# Patient Record
Sex: Female | Born: 1985 | Race: White | Hispanic: No | Marital: Married | Smoking: Never smoker
Health system: Southern US, Community
[De-identification: ages and names within clinical notes are randomized; demographics above are authoritative.]

## PROBLEM LIST (undated history)

## (undated) ENCOUNTER — Inpatient Hospital Stay (HOSPITAL_COMMUNITY): Payer: Self-pay

## (undated) DIAGNOSIS — R51 Headache: Secondary | ICD-10-CM

## (undated) HISTORY — DX: Headache: R51

---
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
## 2007-07-22 ENCOUNTER — Inpatient Hospital Stay (HOSPITAL_COMMUNITY): Admission: AD | Admit: 2007-07-22 | Discharge: 2007-07-24 | Payer: Self-pay | Admitting: Obstetrics and Gynecology

## 2007-07-22 ENCOUNTER — Ambulatory Visit: Payer: Self-pay | Admitting: Obstetrics and Gynecology

## 2008-01-05 ENCOUNTER — Other Ambulatory Visit: Admission: RE | Admit: 2008-01-05 | Discharge: 2008-01-05 | Payer: Self-pay | Admitting: Obstetrics and Gynecology

## 2010-10-25 NOTE — Op Note (Signed)
NAME:  Rita Oconnor, Rita Oconnor                           ACCOUNT NO.:  [id number]   MEDICAL RECORD NO.:  [id number]                  PATIENT TYPE:   LOCATION:  403                                  FACILITY:   PHYSICIAN:  Tilda Burrow, M.D.              DATE OF BIRTH:   DATE OF PROCEDURE:  DATE OF DISCHARGE:                                 OPERATIVE REPORT   DELIVERY NOTE   Rita Oconnor was routinely examined at approximately 10:15 and was noted to be  fully dilated at +3 station so she began pushing and after pushing briefly  and effectively she delivered a viable female infant at 11:04.  The mouth  and nose were suctioned on the perineum with a bulb syringe.  A nuchal cord  was reduced without difficulty.  The body delivered without difficulty.  Weight is 6 pounds 6 ounces, Apgars are 9 and 9.  Twenty units of Pitocin  diluted in 1000 cc of lactated Ringers was infused rapidly IV.   The placenta separated spontaneously and was delivered at 11:08 a.m.  It was  inspected and appears to be intact with a 3-vessel cord. The vagina was then  inspected and a first degree labial lacerations, not necessitating repair  was noted.  Estimated blood loss 200 cc.  Epidural catheter was then removed  with the blue tip visualized as being intact.     ________________________________________  ___________________________________________  Jacklyn Shell, C.N.M.           Tilda Burrow, M.D.   FC/MEDQ  D:  11/02/2003  T:  11/02/2003  Job:  [id number]   cc:   [facility]

## 2010-10-25 NOTE — Op Note (Signed)
NAME:  Rita Oconnor, Rita Oconnor                         ACCOUNT NO.:  [id number]   MEDICAL RECORD NO.:  [id number]                   PATIENT TYPE:  INP   LOCATION:  A403                                 FACILITY:  [facility]   PHYSICIAN:  Lazaro Arms, M.D.                DATE OF BIRTH:  10-Sep-1985   DATE OF PROCEDURE:  DATE OF DISCHARGE:  11/04/2003                                 OPERATIVE REPORT   PROCEDURE:  Epidural placement.   INDICATIONS FOR PROCEDURE:  Thursa is an 25 year old gravida 1, para 0 at 96-  1/[redacted] weeks gestation admitted with an unfavorable cervix.  She had Cervidil  placed last evening.  She is having very quick contractile activity, one on  top of the other.  Cervidil has been removed.  She is requesting an  epidural.   DESCRIPTION OF PROCEDURE:  The patient is placed in the sitting position.  Betadine prep is used.  A 17-gauge Tuohy needle is employed after local  anesthetic placed.  The L2-L3 interspace is identified and loss of  resistance technique employed.  Bupivacaine 0.125% plain, 10 cc, is given  into the epidural space without difficulty.  The epidural catheter is fed  into the epidural space, and an additional 10 cc is given of 0.125%  bupivacaine to dose up the epidural.  It was taped 5 cm into the epidural  space.  The continuous pump is then hooked up at 12 cc an hour.  The patient  had no ill effects.  She is comfortable, and the fetal heart rate tracing is  stable, and blood pressure is stable.      ___________________________________________                                            Lazaro Arms, M.D.   LHE/MEDQ  D:  11/16/2003  T:  11/17/2003  Job:  [id number]

## 2010-10-25 NOTE — H&P (Signed)
NAME:  Rita Oconnor, Rita Oconnor                         ACCOUNT NO.:  [id number]   MEDICAL RECORD NO.:  [id number]                   PATIENT TYPE:  INP   LOCATION:  A428                                 FACILITY:  [facility]   PHYSICIAN:  Lazaro Arms, M.D.                DATE OF BIRTH:  [birth year]   DATE OF ADMISSION:  11/01/2003  DATE OF DISCHARGE:                                HISTORY & PHYSICAL   Karyss is an 25 year old white female, gravida 1, para 0, with an estimated  date of delivery of Oct 28, 2003, currently at 52 and 4/7 weeks' gestation,  who is admitted for cervical ripening and induction of labor.  Her  antepartum course has been unremarkable.  Her cervix is unfavorable,  basically long, thick and closed.  It is vertex and the baby is quite low.  As a result, she is admitted for Cervidil placement and then Pitocin  induction.   PAST MEDICAL HISTORY:  Negative.   PAST SURGICAL HISTORY:  Negative.   PAST OB HISTORY:  Negative.   ALLERGIES:  None.   MEDICATIONS:  Prenatal vitamins.   BLOOD TYPE:  O+.   Rubella immune; antibody screen is negative; hepatitis B is negative; HIV is  nonreactive; serology nonreactive; Pap was normal; GC and chlamydia are both  negative; AFP was normal; group B Strep was negative; Glucola was 115.   HEENT:  Unremarkable.  THYROID:  Normal.  LUNGS:  Clear.  HEART:  Regular rhythm without regurgitation or gallop.  BREASTS:  Deferred.  GENITOURINARY:  Fundal height is 37 cm.  Cervix is small, thick and closed.  Vertex is quite low.  EXTREMITIES:  Warm with no edema.   IMPRESSION:  1. Intrauterine pregnancy at 48 and 4/7 weeks' gestation.  2. Unfavorable cervix.   PLAN:  The patient is admitted for Cervidil cervical ripening followed by  Pitocin induction.  She understands the risks, benefits, indications and  alternatives and we will proceed.     ___________________________________________                                         Lazaro Arms, M.D.   Loraine Maple  D:  11/01/2003  T:  11/01/2003  Job:  [id number]

## 2011-02-28 LAB — CBC
HCT: 25.5 — ABNORMAL LOW
HCT: 32.6 — ABNORMAL LOW
Hemoglobin: 11.3 — ABNORMAL LOW
Hemoglobin: 8.9 — ABNORMAL LOW
MCHC: 34.7
MCHC: 34.8
MCV: 90.8
MCV: 92
Platelets: 147 — ABNORMAL LOW
Platelets: 220
RBC: 2.77 — ABNORMAL LOW
RBC: 3.59 — ABNORMAL LOW
RDW: 14.3
RDW: 14.6
WBC: 11.4 — ABNORMAL HIGH
WBC: 11.6 — ABNORMAL HIGH

## 2011-02-28 LAB — RPR: RPR Ser Ql: NONREACTIVE

## 2011-05-23 ENCOUNTER — Emergency Department (HOSPITAL_COMMUNITY)
Admission: EM | Admit: 2011-05-23 | Discharge: 2011-05-23 | Disposition: A | Discharge status: Home / Self Care | Payer: Self-pay | Attending: Emergency Medicine | Admitting: Emergency Medicine

## 2011-05-23 ENCOUNTER — Encounter: Payer: Self-pay | Admitting: *Deleted

## 2011-05-23 ENCOUNTER — Emergency Department (HOSPITAL_COMMUNITY): Payer: Self-pay

## 2011-05-23 DIAGNOSIS — M5416 Radiculopathy, lumbar region: Secondary | ICD-10-CM

## 2011-05-23 DIAGNOSIS — M545 Low back pain, unspecified: Secondary | ICD-10-CM | POA: Insufficient documentation

## 2011-05-23 DIAGNOSIS — IMO0002 Reserved for concepts with insufficient information to code with codable children: Secondary | ICD-10-CM | POA: Insufficient documentation

## 2011-05-23 MED ORDER — PREDNISONE 20 MG PO TABS: 60.0000 mg | ORAL_TABLET | Freq: Every day | ORAL | Status: DC

## 2011-05-23 MED ORDER — OXYCODONE-ACETAMINOPHEN 5-325 MG PO TABS: ORAL_TABLET | ORAL | Status: DC

## 2011-05-23 MED ORDER — PREDNISONE 20 MG PO TABS
60.0000 mg | ORAL_TABLET | Freq: Once | ORAL | Status: AC
  Administered 2011-05-23: 60 mg via ORAL
  Filled 2011-05-23: qty 3

## 2011-05-23 MED ORDER — OXYCODONE-ACETAMINOPHEN 5-325 MG PO TABS
1.0000 | ORAL_TABLET | Freq: Once | ORAL | Status: AC
  Administered 2011-05-23: 1 via ORAL
  Filled 2011-05-23: qty 1

## 2011-05-23 NOTE — ED Provider Notes (Signed)
Medical screening examination/treatment/procedure(s) were performed by non-physician practitioner and as supervising physician I was immediately available for consultation/collaboration.   Benny Lennert, MD 05/23/11 1539

## 2011-05-23 NOTE — ED Provider Notes (Signed)
History     CSN: [id number] Arrival date & time: 05/23/2011 10:02 AM   First MD Initiated Contact with Patient 05/23/11 1038      Chief Complaint  Patient presents with  . Tailbone Pain    (Consider location/radiation/quality/duration/timing/severity/associated sxs/prior treatment) HPI Comments: Pt was taking a shower this AM.  Raised a leg onto side of tub and had an immediate pain in her lower back.  Pain now radiating to her R knee.  H/o low back pain in past.  The history is provided by the patient. No language interpreter was used.    History reviewed. No pertinent past medical history.  History reviewed. No pertinent past surgical history.  No family history on file.  History  Substance Use Topics  . Smoking status: Never Smoker   . Smokeless tobacco: Not on file  . Alcohol Use: No    OB History    Grav Para Term Preterm Abortions TAB SAB Ect Mult Living                  Review of Systems  Musculoskeletal: Positive for back pain.  All other systems reviewed and are negative.    Allergies  Review of patient's allergies indicates no known allergies.  Home Medications  No current outpatient prescriptions on file.  BP 132/77  Pulse 88  Temp(Src) 97.7 F (36.5 C) (Oral)  Resp 20  Ht 6' (1.829 m)  Wt 175 lb (79.379 kg)  BMI 23.73 kg/m2  SpO2 100%  LMP 05/23/2011  Physical Exam  Nursing note and vitals reviewed. Constitutional: She is oriented to person, place, and time. She appears well-developed and well-nourished. No distress.  HENT:  Head: Normocephalic and atraumatic.  Eyes: EOM are normal.  Neck: Normal range of motion.  Cardiovascular: Normal rate, regular rhythm and normal heart sounds.   Pulmonary/Chest: Effort normal and breath sounds normal.  Abdominal: Soft. She exhibits no distension. There is no tenderness.  Musculoskeletal:       Lumbar back: She exhibits decreased range of motion and pain. She exhibits no tenderness, no bony  tenderness, no swelling, no edema, no deformity, no laceration, no spasm and normal pulse.       Back:  Neurological: She is alert and oriented to person, place, and time. She displays normal reflexes. Coordination normal.  Reflex Scores:      Patellar reflexes are 3+ on the right side and 3+ on the left side.      Achilles reflexes are 3+ on the right side and 3+ on the left side. Skin: Skin is warm and dry.  Psychiatric: She has a normal mood and affect. Judgment normal.    ED Course  Procedures (including critical care time)  Labs Reviewed - No data to display No results found.   No diagnosis found.    MDM  Discussed x-ray results with pt and her husband.  Pt states she is feeling better than before.        Worthy Rancher, PA 05/23/11 [phone]

## 2011-05-23 NOTE — ED Notes (Signed)
States was taking shower and raised right leg and felt sharp pain in lower back near tailbone area.  Denies falling.  C/o increased pain since.

## 2011-12-03 ENCOUNTER — Emergency Department (HOSPITAL_COMMUNITY)
Admission: EM | Admit: 2011-12-03 | Discharge: 2011-12-03 | Disposition: A | Discharge status: Home / Self Care | Payer: Self-pay | Attending: Emergency Medicine | Admitting: Emergency Medicine

## 2011-12-03 ENCOUNTER — Encounter (HOSPITAL_COMMUNITY): Payer: Self-pay | Admitting: *Deleted

## 2011-12-03 DIAGNOSIS — R42 Dizziness and giddiness: Secondary | ICD-10-CM | POA: Insufficient documentation

## 2011-12-03 DIAGNOSIS — R0602 Shortness of breath: Secondary | ICD-10-CM | POA: Insufficient documentation

## 2011-12-03 LAB — CBC WITH DIFFERENTIAL/PLATELET
Basophils Relative: 0 % (ref 0–1)
Eosinophils Absolute: 0.1 10*3/uL (ref 0.0–0.7)
MCH: 30.6 pg (ref 26.0–34.0)
MCHC: 32.1 g/dL (ref 30.0–36.0)
Monocytes Relative: 8 % (ref 3–12)
Neutrophils Relative %: 61 % (ref 43–77)
Platelets: 249 10*3/uL (ref 150–400)

## 2011-12-03 LAB — D-DIMER, QUANTITATIVE: D-Dimer, Quant: <0.22 ug/mL-FEU (ref 0.00–0.48)

## 2011-12-03 LAB — URINALYSIS, ROUTINE W REFLEX MICROSCOPIC
Glucose, UA: NEGATIVE mg/dL
Ketones, ur: 15 mg/dL — AB
Leukocytes, UA: NEGATIVE
Specific Gravity, Urine: >1.03 — ABNORMAL HIGH (ref 1.005–1.030)
pH: 6 (ref 5.0–8.0)

## 2011-12-03 LAB — COMPREHENSIVE METABOLIC PANEL
Albumin: 4.7 g/dL (ref 3.5–5.2)
Alkaline Phosphatase: 42 U/L (ref 39–117)
BUN: 17 mg/dL (ref 6–23)
Potassium: 3.2 mEq/L — ABNORMAL LOW (ref 3.5–5.1)
Sodium: 139 mEq/L (ref 135–145)
Total Protein: 8 g/dL (ref 6.0–8.3)

## 2011-12-03 LAB — PREGNANCY, URINE: Preg Test, Ur: NEGATIVE

## 2011-12-03 LAB — LIPASE, BLOOD: Lipase: 26 U/L (ref 11–59)

## 2011-12-03 MED ORDER — ALBUTEROL SULFATE HFA 108 (90 BASE) MCG/ACT IN AERS
1.0000 | INHALATION_SPRAY | Freq: Four times a day (QID) | RESPIRATORY_TRACT | Status: DC | PRN
Start: 2011-12-03 — End: 2012-09-17

## 2011-12-03 NOTE — ED Notes (Signed)
Sob intermittently  For 2 mos, worse last week.   No cough, no cold, no fever.episodes last up to 1 minute.

## 2011-12-03 NOTE — Discharge Instructions (Signed)
RESOURCE GUIDE  Chronic Pain Problems: Contact [facility]  [phone] Patients need to be referred by their primary care doctor.  Insufficient Money for Medicine: Contact United Way:  call "211" or [facility] [phone].  No Primary Care Doctor: - Call [facility]  [phone] - can help you locate a primary care doctor that  accepts your insurance, provides certain services, etc. - Physician Referral Service- 1-800-533-3463  Agencies that provide inexpensive medical care: - [facility]  [phone] - [facility]  [phone] - [facility]  [phone] - [facility] Clinic  [phone] - Planned Parenthood  [phone] - [facility]  [phone]  Medicaid-accepting [place] Providers: - [facility]- [address]  [phone], Mon-Fri 9am-7pm, Sat 9am-1pm - [facility]- [address], [address]  [phone] - [facility]- [address], [address]  [phone] - [facility]- [address] [place] [address]  [phone] - Veita Bland- [address], [address], [phone]  Only accepts [place] Access Medicaid patients after they have their name  applied to their card  Self Pay (no insurance) in [place]: - Sickle Cell Patients: Dr Eric Dean, [facility]  [address], [phone] - [facility]- [address]  [phone]       -     [facility] [place] [facility]- [address]       -     [facility]- see information above (Speak to Pam H if you do not have insurance)       -  [facility]- [address], [phone]       -  [facility] [place]- [address],  [phone]       -  [facility]- [address] [place] [address], [phone]       -  Dr Osei-Bonsu-  [address], [place], [phone]       -  [facility]- [address], [phone]       -  [facility]- [address] [place] [address], [phone], also [address], [phone]       -    [facility]- [address], [phone], 1st & 3rd Saturday   every month, 10am-1pm  1) Find a Doctor and Pay Out of Pocket Although you won't have to find out who is covered by your insurance plan, it is a good idea to ask around and get recommendations. You will then need to call the office and see if the doctor you have chosen will accept you as a new patient and what types of options they offer for patients who are self-pay. Some doctors offer discounts or will set up payment plans for their patients who do not have insurance, but you will need to ask so you aren't surprised when you get to your appointment.  2) Contact Your Local Health Department Not all health departments have doctors that can see patients for sick visits, but many do, so it is worth a call to see if yours does. If you don't know where your local health department is, you can check in your phone book. The CDC also has a tool to help you locate your state's health department, and many state websites also have   listings of all of their local health departments.  3) Find a Walk-in Clinic If your illness is not likely to be very severe or complicated, you may want to try a walk in clinic. These are popping up all over the country in pharmacies, drugstores, and shopping centers. They're usually staffed by nurse practitioners or physician assistants that have been trained to treat common illnesses and complaints. They're usually fairly quick and inexpensive. However, if you have serious medical issues or chronic medical problems, these are probably not your best option  STD Testing - [place] [facility] [place], [facility] Clinic, [address], [place], phone [phone] or 1-877-539-9860.  Monday - Friday, call for an appointment. - [place]  [facility] [place], [facility]D Clinic, [address], [place], phone [phone] or 1-877-539-9860.  Monday - Friday, call for an appointment.  Abuse/Neglect: - [place] Child Abuse Hotline (336) 641-3795 - [place] Child Abuse Hotline 800-378-5315 (After Hours)  Emergency Shelter:  [facility] (336) 271-5985  Maternity Homes: - [facility] (336) 275-9566 - [facility] (704) 372-4663  MRSA Hotline #:   832-7006  [place] Resources  [facility] [place]  [employer] [place] [facility] [address]                 [address]         [address]  [place]                                               [place]                              [place] Phone:  [phone]                                  Phone:  [phone]                   Phone:  [phone]  [place] [facility], [phone] - [place] Services - [facility]- 1-888-581-9988       -     [facility] in [place], [address],                                  336-349-4454, Insurance  [place] Child Abuse Hotline (336) 342-1394 or (336) 342-3537 (After Hours)   Behavioral Health Services  Substance Abuse Resources: - [facility]  336-882-2125 - [facility] 336-784-9470 - [facility] 336-285-9073 - [facility] 336-845-3988 - Residential & Outpatient Substance Abuse Program  800-659-3381  Psychological Services: -  Health  [phone] - [facility]  [phone] - [place] [facility], [address], [place], ACCESS LINE: 1-800-853-5163 or 336-641-4981, Http://www.[facility].com/services/adult.htm  Dental Assistance  If unable to pay or uninsured, contact:  [facility] or [place] [facility] to become qualified for the adult dental  clinic.  Patients with Medicaid:  [facility] [address], [phone], [phone]  If unable   to pay, or uninsured, contact [facility] 4785762372) or [facility] Department (701) 185-0411 in [place], [phone] in [facility]) to become qualified for the adult dental clinic  Other Low-Cost Community Dental Services: - [facility]- [address] [place], [place], [place], [address], [phone], Ext. [address], 2nd and 4th Thursday of the month at 6:30am.  10 clients each day by appointment, can sometimes see walk-in patients if someone does not show for an appointment. [facility]- [address] [place], [place], [address], [phone] - [facility]- [address], [place], [place], [address], [phone] [facility] Health Department- [phone] [facility] Health Department- [phone] [facility] [phone]  Resource guide provided to help patient find a primary care physician for followup followup should include thyroid testing. Trial of albuterol inhaler in the meantime use every 6 hours 2 puffs for the next week to see if it makes a difference with the symptoms.

## 2011-12-03 NOTE — ED Provider Notes (Signed)
History     CSN: [id number]  Arrival date & time 12/03/11  2014   First MD Initiated Contact with Patient 12/03/11 2030      Chief Complaint  Patient presents with  . Shortness of Breath    (Consider location/radiation/quality/duration/timing/severity/associated sxs/prior treatment) Patient is a 26 y.o. female presenting with shortness of breath.  Shortness of Breath  The current episode started more than 2 weeks ago. The onset was sudden. The problem occurs frequently. The problem has been unchanged. The problem is moderate. Nothing relieves the symptoms. Nothing aggravates the symptoms. Associated symptoms include shortness of breath. Pertinent negatives include no chest pain, no chest pressure, no fever, no rhinorrhea, no sore throat, no stridor, no cough and no wheezing.   ongoing for 2 months but worse in the past week. Patient gets sudden onset brief episodes of shortness of breath the longest they last as a minute most are under a minute. Not associated with chest pain occasionally associated with dizziness. No prior symptoms similar to this. No nausea no vomiting no diarrhea no headache no throat tightness no sore throat. No cough or upper spore infection symptoms. Patient has not been seen for this. Past medical history is negative.  History reviewed. No pertinent past medical history.  History reviewed. No pertinent past surgical history.  History reviewed. No pertinent family history.  History  Substance Use Topics  . Smoking status: Never Smoker   . Smokeless tobacco: Not on file  . Alcohol Use: No    OB History    Grav Para Term Preterm Abortions TAB SAB Ect Mult Living                  Review of Systems  Constitutional: Negative for fever.  HENT: Negative for sore throat, rhinorrhea and neck pain.   Eyes: Negative for visual disturbance.  Respiratory: Positive for shortness of breath. Negative for cough, wheezing and stridor.   Cardiovascular: Negative for  chest pain.  Gastrointestinal: Negative for nausea, vomiting, abdominal pain and diarrhea.  Genitourinary: Negative for dysuria.  Musculoskeletal: Negative for back pain.  Skin: Negative for rash.  Neurological: Positive for dizziness. Negative for syncope, weakness, numbness and headaches.  Hematological: Does not bruise/bleed easily.    Allergies  Review of patient's allergies indicates no known allergies.  Home Medications  No current outpatient prescriptions on file.  BP 152/87  Pulse 80  Temp 98.3 F (36.8 C) (Oral)  Resp 20  Ht 6' (1.829 m)  Wt 175 lb (79.379 kg)  BMI 23.73 kg/m2  SpO2 100%  LMP 11/25/2011  Physical Exam  Nursing note and vitals reviewed. Constitutional: She is oriented to person, place, and time. She appears well-developed and well-nourished. No distress.  HENT:  Head: Normocephalic and atraumatic.  Mouth/Throat: Oropharynx is clear and moist. No oropharyngeal exudate.  Eyes: Conjunctivae and EOM are normal. Pupils are equal, round, and reactive to light.  Neck: Normal range of motion. Neck supple. No JVD present. No tracheal deviation present. No thyromegaly present.  Cardiovascular: Normal rate, regular rhythm and normal heart sounds.   No murmur heard. Pulmonary/Chest: Effort normal and breath sounds normal. No stridor. No respiratory distress. She has no wheezes. She has no rales. She exhibits no tenderness.  Abdominal: Soft. Bowel sounds are normal. There is no tenderness.  Musculoskeletal: Normal range of motion. She exhibits no edema and no tenderness.  Lymphadenopathy:    She has no cervical adenopathy.  Neurological: She is alert and oriented to person, place,  and time. No cranial nerve deficit. She exhibits normal muscle tone. Coordination normal.  Skin: Skin is warm. No rash noted.    ED Course  Procedures (including critical care time)   Labs Reviewed  CBC WITH DIFFERENTIAL  COMPREHENSIVE METABOLIC PANEL  URINALYSIS, ROUTINE W  REFLEX MICROSCOPIC  PREGNANCY, URINE  LIPASE, BLOOD  D-DIMER, QUANTITATIVE   No results found.  Date: 12/03/2011  Rate: 68  Rhythm: normal sinus rhythm, sinus arrhythmia and premature ventricular contractions (PVC)  QRS Axis: normal  Intervals: normal  ST/T Wave abnormalities: normal  Conduction Disutrbances:none  Narrative Interpretation:   Old EKG Reviewed: none available  Results for orders placed during the hospital encounter of 12/03/11  URINALYSIS, ROUTINE W REFLEX MICROSCOPIC      Component Value Range   Color, Urine AMBER (*) YELLOW   APPearance CLEAR  CLEAR   Specific Gravity, Urine >1.030 (*) 1.005 - 1.030   pH 6.0  5.0 - 8.0   Glucose, UA NEGATIVE  NEGATIVE mg/dL   Hgb urine dipstick NEGATIVE  NEGATIVE   Bilirubin Urine SMALL (*) NEGATIVE   Ketones, ur 15 (*) NEGATIVE mg/dL   Protein, ur NEGATIVE  NEGATIVE mg/dL   Urobilinogen, UA 0.2  0.0 - 1.0 mg/dL   Nitrite NEGATIVE  NEGATIVE   Leukocytes, UA NEGATIVE  NEGATIVE  PREGNANCY, URINE      Component Value Range   Preg Test, Ur NEGATIVE  NEGATIVE     1. Shortness of breath       MDM  The patient's episodes of shortness of breath are very brief they have been long-standing patient did have a premature ventricular contraction during her EKG but did not have a episode of the symptoms during that time. Workup will include chest x-ray labs EKG which is already done and cardiac monitoring. Urinalysis is negative pregnancy test is negative. If labs are all negative would recommend referral for thyroid testing will provide patient with resource guide to help find a primary care Dr. If cardiac monitoring were lab test worn a [place] that will be done by the hospitalist. Is suspected this time patient will be okay for discharge home.       Shelda Jakes, MD 12/03/11 2137

## 2011-12-20 IMAGING — CR DG LUMBAR SPINE COMPLETE 4+V
5 series · 5 of 5 positions shown · non-contrast
Comparison: None.

CLINICAL DATA: Acute low back pain with radiation to the right
knee.

LUMBAR SPINE - COMPLETE 4+ VIEW

[view not recorded (1 of 5)]
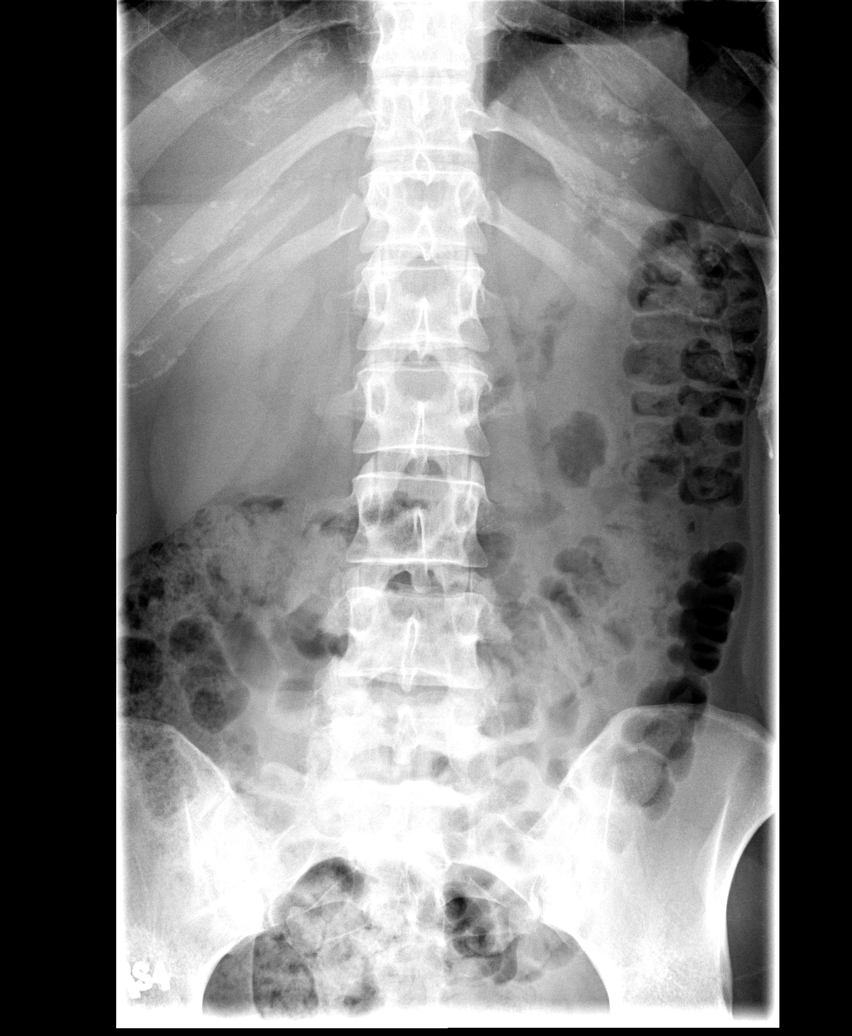

[view not recorded (2 of 5)]
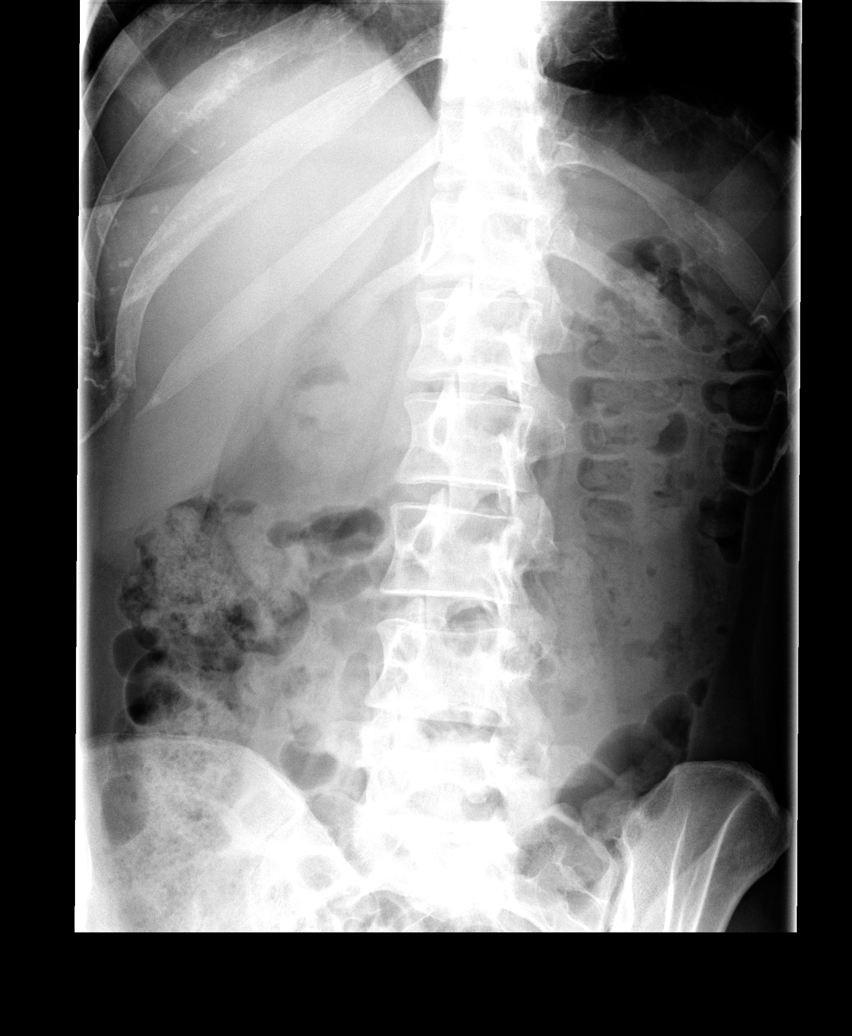

[view not recorded (3 of 5)]
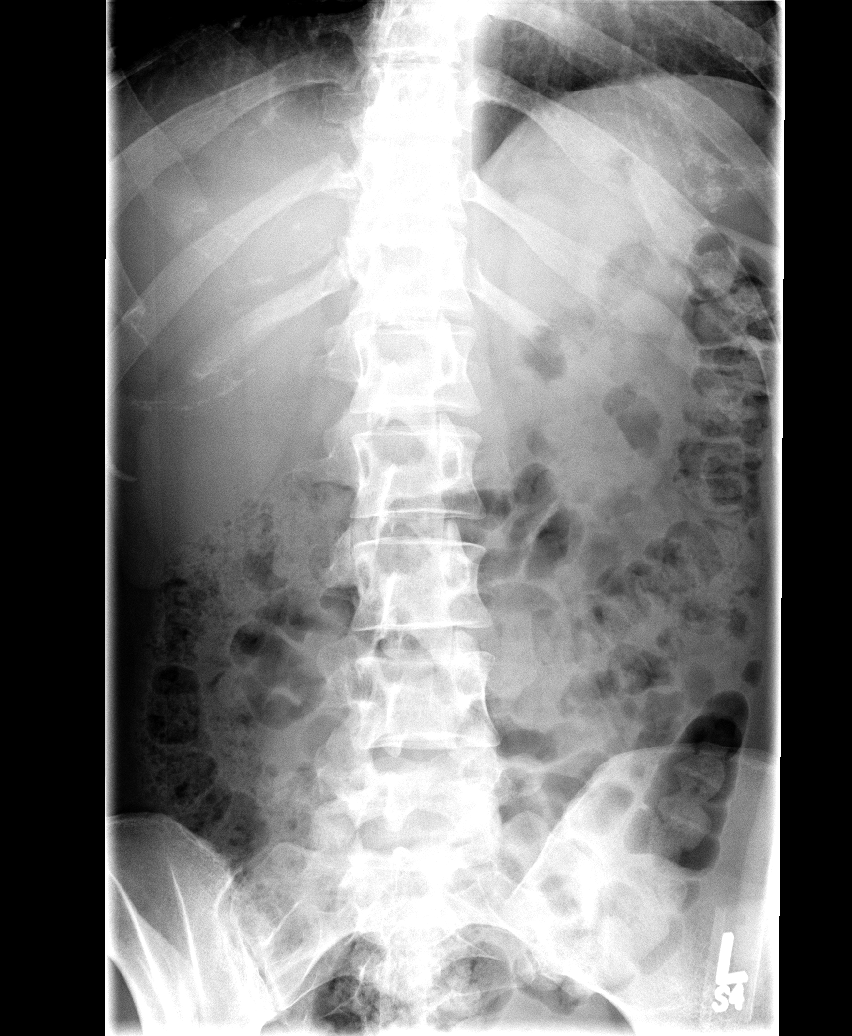

[view not recorded (4 of 5)]
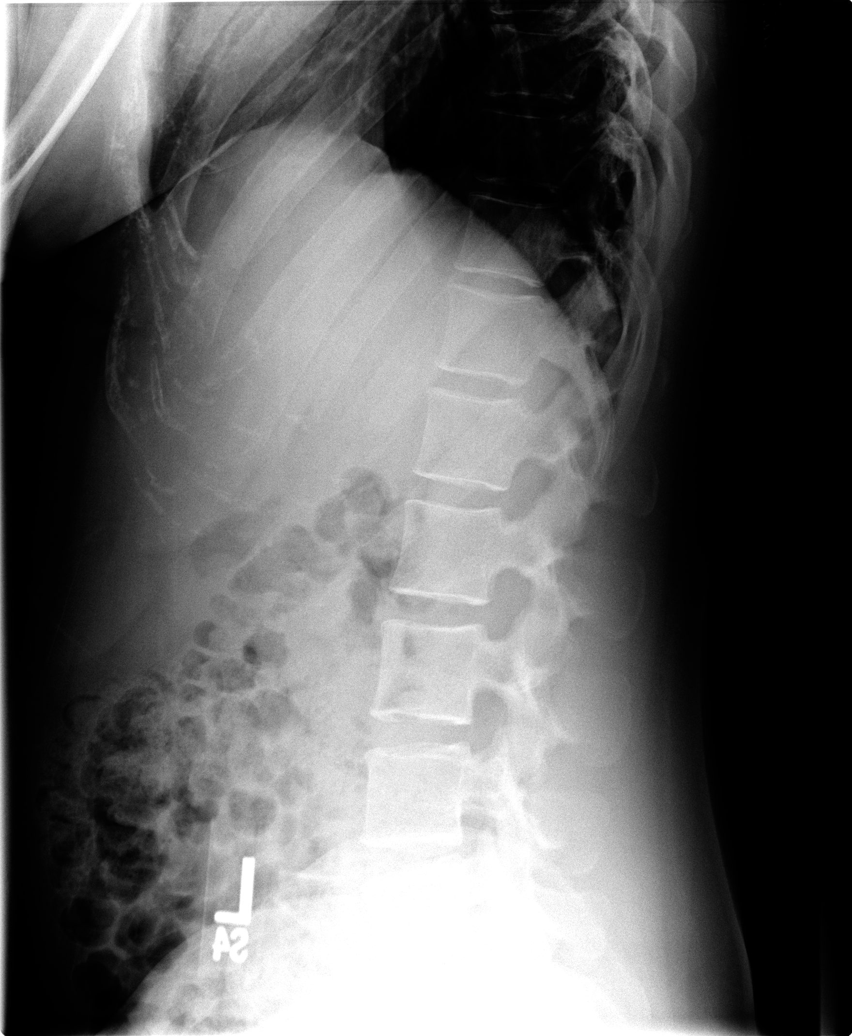

[view not recorded (5 of 5)]
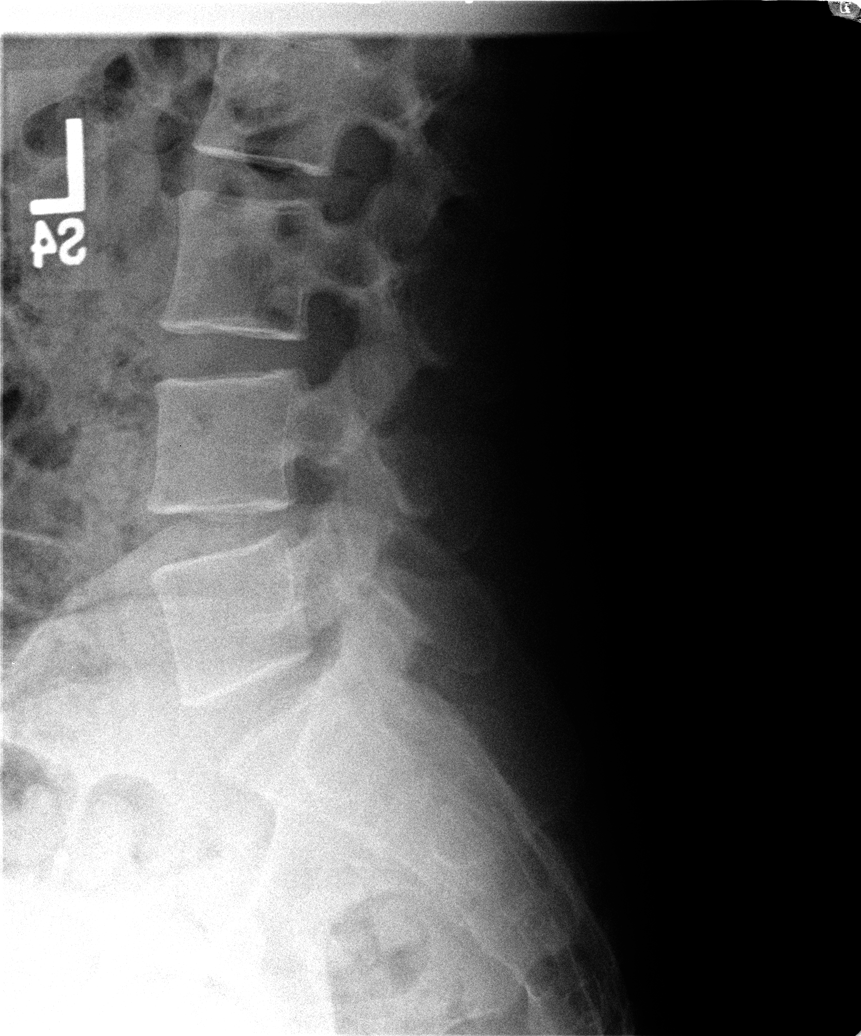

[5 of 5 positions shown; findings below may reference images not displayed]

FINDINGS: Alignment is anatomic.  Vertebral body and disc space
height are maintained. No pars defects.  No degenerative changes.
IMPRESSION: Negative.

## 2012-03-15 LAB — OB RESULTS CONSOLE RPR: RPR: NONREACTIVE

## 2012-03-15 LAB — OB RESULTS CONSOLE ANTIBODY SCREEN: Antibody Screen: NEGATIVE

## 2012-06-09 NOTE — L&D Delivery Note (Signed)
Delivery Note At 1:10 AM a viable female was delivered via Vaginal, Spontaneous Delivery (Presentation: LOA  ).  APGAR: 8, 9; Weight: not available at time of note  .   Placenta status: Intact, Spontaneous.  Cord: 3 vessels with the following complications: None.  Cord pH: not done  Anesthesia: Epidural  Episiotomy: None Lacerations: None Suture Repair: n/a Est. Blood Loss (mL): 250  Bladder emptied of app 300cc w/ fundal massage   Mom to postpartum.  Baby to nursery-stable. Breastfeeding, micronor for contraception, OP circumcision at FT  [name] 10/03/2012, 1:34 AM

## 2012-06-30 LAB — OB RESULTS CONSOLE HGB/HCT, BLOOD: Hemoglobin: 10.3 g/dL

## 2012-08-06 ENCOUNTER — Encounter: Payer: Self-pay | Admitting: *Deleted

## 2012-08-30 ENCOUNTER — Telehealth: Payer: Self-pay | Admitting: Obstetrics & Gynecology

## 2012-08-30 NOTE — Telephone Encounter (Signed)
Patient called with c/o having pain since Saturday. A shocking pain in the bottom of her stomach that comes and goes. Stated pain is worse with activity. Patient denies any bleeding, or gush of fluid. She is feeling the baby move. Patient was advised to come in tomorrow to be evaluated, and that if anything changed to go to [facility]. Patient verbalized understanding.

## 2012-08-31 ENCOUNTER — Ambulatory Visit (INDEPENDENT_AMBULATORY_CARE_PROVIDER_SITE_OTHER): Payer: Medicaid Other | Admitting: Obstetrics & Gynecology

## 2012-08-31 VITALS — BP 128/78 | Wt 199.0 lb

## 2012-08-31 DIAGNOSIS — Z3483 Encounter for supervision of other normal pregnancy, third trimester: Secondary | ICD-10-CM

## 2012-08-31 DIAGNOSIS — Z331 Pregnant state, incidental: Secondary | ICD-10-CM

## 2012-08-31 DIAGNOSIS — Z3480 Encounter for supervision of other normal pregnancy, unspecified trimester: Secondary | ICD-10-CM | POA: Insufficient documentation

## 2012-08-31 DIAGNOSIS — Z1389 Encounter for screening for other disorder: Secondary | ICD-10-CM

## 2012-08-31 DIAGNOSIS — Z3490 Encounter for supervision of normal pregnancy, unspecified, unspecified trimester: Secondary | ICD-10-CM

## 2012-08-31 LAB — POCT URINALYSIS DIPSTICK
Glucose, UA: NEGATIVE
Leukocytes, UA: NEGATIVE
Nitrite, UA: NEGATIVE

## 2012-08-31 NOTE — Progress Notes (Signed)
Pt. States "not feeling movement as much", but is feeling baby move 10 times in 2 hours.  GBS, GC/CHL today.

## 2012-08-31 NOTE — Patient Instructions (Signed)
Pregnancy - Third Trimester  The third trimester of pregnancy (the last 3 months) is a period of the most rapid growth for you and your baby. The baby approaches a length of 20 inches and a weight of 6 to 10 pounds. The baby is adding on fat and getting ready for life outside your body. While inside, babies have periods of sleeping and waking, suck their thumbs, and hiccups. You can often feel small contractions of the uterus. This is false labor. It is also called Braxton-Hicks contractions. This is like a practice for labor. The usual problems in this stage of pregnancy include more difficulty breathing, swelling of the hands and feet from water retention, and having to urinate more often because of the uterus and baby pressing on your bladder.   PRENATAL EXAMS  · Blood work may continue to be done during prenatal exams. These tests are done to check on your health and the probable health of your baby. Blood work is used to follow your blood levels (hemoglobin). Anemia (low hemoglobin) is common during pregnancy. Iron and vitamins are given to help prevent this. You may also continue to be checked for diabetes. Some of the past blood tests may be done again.  · The size of the uterus is measured during each visit. This makes sure your baby is growing properly according to your pregnancy dates.  · Your blood pressure is checked every prenatal visit. This is to make sure you are not getting toxemia.  · Your urine is checked every prenatal visit for infection, diabetes and protein.  · Your weight is checked at each visit. This is done to make sure gains are happening at the suggested rate and that you and your baby are growing normally.  · Sometimes, an ultrasound is performed to confirm the position and the proper growth and development of the baby. This is a test done that bounces harmless sound waves off the baby so your caregiver can more accurately determine due dates.  · Discuss the type of pain medication and  anesthesia you will have during your labor and delivery.  · Discuss the possibility and anesthesia if a Cesarean Section might be necessary.  · Inform your caregiver if there is any mental or physical violence at home.  Sometimes, a specialized non-stress test, contraction stress test and biophysical profile are done to make sure the baby is not having a problem. Checking the amniotic fluid surrounding the baby is called an amniocentesis. The amniotic fluid is removed by sticking a needle into the belly (abdomen). This is sometimes done near the end of pregnancy if an early delivery is required. In this case, it is done to help make sure the baby's lungs are mature enough for the baby to live outside of the womb. If the lungs are not mature and it is unsafe to deliver the baby, an injection of cortisone medication is given to the mother 1 to 2 days before the delivery. This helps the baby's lungs mature and makes it safer to deliver the baby.  CHANGES OCCURING IN THE THIRD TRIMESTER OF PREGNANCY  Your body goes through many changes during pregnancy. They vary from person to person. Talk to your caregiver about changes you notice and are concerned about.  · During the last trimester, you have probably had an increase in your appetite. It is normal to have cravings for certain foods. This varies from person to person and pregnancy to pregnancy.  · You may begin to   get stretch marks on your hips, abdomen, and breasts. These are normal changes in the body during pregnancy. There are no exercises or medications to take which prevent this change.  · Constipation may be treated with a stool softener or adding bulk to your diet. Drinking lots of fluids, fiber in vegetables, fruits, and whole grains are helpful.  · Exercising is also helpful. If you have been very active up until your pregnancy, most of these activities can be continued during your pregnancy. If you have been less active, it is helpful to start an exercise  program such as walking. Consult your caregiver before starting exercise programs.  · Avoid all smoking, alcohol, un-prescribed drugs, herbs and "street drugs" during your pregnancy. These chemicals affect the formation and growth of the baby. Avoid chemicals throughout the pregnancy to ensure the delivery of a healthy infant.  · Backache, varicose veins and hemorrhoids may develop or get worse.  · You will tire more easily in the third trimester, which is normal.  · The baby's movements may be stronger and more often.  · You may become short of breath easily.  · Your belly button may stick out.  · A yellow discharge may leak from your breasts called colostrum.  · You may have a bloody mucus discharge. This usually occurs a few days to a week before labor begins.  HOME CARE INSTRUCTIONS   · Keep your caregiver's appointments. Follow your caregiver's instructions regarding medication use, exercise, and diet.  · During pregnancy, you are providing food for you and your baby. Continue to eat regular, well-balanced meals. Choose foods such as meat, fish, milk and other low fat dairy products, vegetables, fruits, and whole-grain breads and cereals. Your caregiver will tell you of the ideal weight gain.  · A physical sexual relationship may be continued throughout pregnancy if there are no other problems such as early (premature) leaking of amniotic fluid from the membranes, vaginal bleeding, or belly (abdominal) pain.  · Exercise regularly if there are no restrictions. Check with your caregiver if you are unsure of the safety of your exercises. Greater weight gain will occur in the last 2 trimesters of pregnancy. Exercising helps:  · Control your weight.  · Get you in shape for labor and delivery.  · You lose weight after you deliver.  · Rest a lot with legs elevated, or as needed for leg cramps or low back pain.  · Wear a good support or jogging bra for breast tenderness during pregnancy. This may help if worn during  sleep. Pads or tissues may be used in the bra if you are leaking colostrum.  · Do not use hot tubs, steam rooms, or saunas.  · Wear your seat belt when driving. This protects you and your baby if you are in an accident.  · Avoid raw meat, cat litter boxes and soil used by cats. These carry germs that can cause birth defects in the baby.  · It is easier to loose urine during pregnancy. Tightening up and strengthening the pelvic muscles will help with this problem. You can practice stopping your urination while you are going to the bathroom. These are the same muscles you need to strengthen. It is also the muscles you would use if you were trying to stop from passing gas. You can practice tightening these muscles up 10 times a set and repeating this about 3 times per day. Once you know what muscles to tighten up, do not perform these   exercises during urination. It is more likely to cause an infection by backing up the urine.  · Ask for help if you have financial, counseling or nutritional needs during pregnancy. Your caregiver will be able to offer counseling for these needs as well as refer you for other special needs.  · Make a list of emergency phone numbers and have them available.  · Plan on getting help from family or friends when you go home from the hospital.  · Make a trial run to the hospital.  · Take prenatal classes with the father to understand, practice and ask questions about the labor and delivery.  · Prepare the baby's room/nursery.  · Do not travel out of the city unless it is absolutely necessary and with the advice of your caregiver.  · Wear only low or no heal shoes to have better balance and prevent falling.  MEDICATIONS AND DRUG USE IN PREGNANCY  · Take prenatal vitamins as directed. The vitamin should contain 1 milligram of folic acid. Keep all vitamins out of reach of children. Only a couple vitamins or tablets containing iron may be fatal to a baby or young child when ingested.  · Avoid use  of all medications, including herbs, over-the-counter medications, not prescribed or suggested by your caregiver. Only take over-the-counter or prescription medicines for pain, discomfort, or fever as directed by your caregiver. Do not use aspirin, ibuprofen (Motrin®, Advil®, Nuprin®) or naproxen (Aleve®) unless OK'd by your caregiver.  · Let your caregiver also know about herbs you may be using.  · Alcohol is related to a number of birth defects. This includes fetal alcohol syndrome. All alcohol, in any form, should be avoided completely. Smoking will cause low birth rate and premature babies.  · Street/illegal drugs are very harmful to the baby. They are absolutely forbidden. A baby born to an addicted mother will be addicted at birth. The baby will go through the same withdrawal an adult does.  SEEK MEDICAL CARE IF:  You have any concerns or worries during your pregnancy. It is better to call with your questions if you feel they cannot wait, rather than worry about them.  DECISIONS ABOUT CIRCUMCISION  You may or may not know the sex of your baby. If you know your baby is a boy, it may be time to think about circumcision. Circumcision is the removal of the foreskin of the penis. This is the skin that covers the sensitive end of the penis. There is no proven medical need for this. Often this decision is made on what is popular at the time or based upon religious beliefs and social issues. You can discuss these issues with your caregiver or pediatrician.  SEEK IMMEDIATE MEDICAL CARE IF:   · An unexplained oral temperature above 102° F (38.9° C) develops, or as your caregiver suggests.  · You have leaking of fluid from the vagina (birth canal). If leaking membranes are suspected, take your temperature and tell your caregiver of this when you call.  · There is vaginal spotting, bleeding or passing clots. Tell your caregiver of the amount and how many pads are used.  · You develop a bad smelling vaginal discharge with  a change in the color from clear to white.  · You develop vomiting that lasts more than 24 hours.  · You develop chills or fever.  · You develop shortness of breath.  · You develop burning on urination.  · You loose more than 2 pounds of weight   or gain more than 2 pounds of weight or as suggested by your caregiver.  · You notice sudden swelling of your face, hands, and feet or legs.  · You develop belly (abdominal) pain. Round ligament discomfort is a common non-cancerous (benign) cause of abdominal pain in pregnancy. Your caregiver still must evaluate you.  · You develop a severe headache that does not go away.  · You develop visual problems, blurred or double vision.  · If you have not felt your baby move for more than 1 hour. If you think the baby is not moving as much as usual, eat something with sugar in it and lie down on your left side for an hour. The baby should move at least 4 to 5 times per hour. Call right away if your baby moves less than that.  · You fall, are in a car accident or any kind of trauma.  · There is mental or physical violence at home.  Document Released: 05/20/2001 Document Revised: 08/18/2011 Document Reviewed: 11/22/2008  Exit[employer]® Patient Information ©2013 [employer].

## 2012-08-31 NOTE — Progress Notes (Signed)
Pelvic pressure and pain otherwise no problems.  Patient reports good fetal movement, denies any bleeding and no rupture of membranes symptoms or contraction.  No other complaints and all questions answered.

## 2012-09-01 LAB — OB RESULTS CONSOLE GC/CHLAMYDIA: Gonorrhea: NEGATIVE

## 2012-09-03 LAB — OB RESULTS CONSOLE GBS: GBS: NEGATIVE

## 2012-09-07 ENCOUNTER — Encounter: Payer: Self-pay | Admitting: Advanced Practice Midwife

## 2012-09-07 ENCOUNTER — Ambulatory Visit (INDEPENDENT_AMBULATORY_CARE_PROVIDER_SITE_OTHER): Payer: Medicaid Other | Admitting: Advanced Practice Midwife

## 2012-09-07 VITALS — BP 108/78 | Wt 200.0 lb

## 2012-09-07 DIAGNOSIS — Z331 Pregnant state, incidental: Secondary | ICD-10-CM

## 2012-09-07 DIAGNOSIS — Z3483 Encounter for supervision of other normal pregnancy, third trimester: Secondary | ICD-10-CM

## 2012-09-07 DIAGNOSIS — R81 Glycosuria: Secondary | ICD-10-CM

## 2012-09-07 DIAGNOSIS — Z3480 Encounter for supervision of other normal pregnancy, unspecified trimester: Secondary | ICD-10-CM

## 2012-09-07 LAB — POCT URINALYSIS DIPSTICK
Nitrite, UA: NEGATIVE
Protein, UA: NEGATIVE

## 2012-09-07 LAB — GLUCOSE, POCT (MANUAL RESULT ENTRY): POC Glucose: 91 mg/dl (ref 70–99)

## 2012-09-07 LAB — OB RESULTS CONSOLE GC/CHLAMYDIA: Gonorrhea: NEGATIVE

## 2012-09-07 NOTE — Progress Notes (Signed)
Had Special K cereal this am and Vemma has 8 grams of sugar in it.

## 2012-09-07 NOTE — Assessment & Plan Note (Addendum)
Clinic:[facility]  Genetic Screen NT:  Increased r/f DS               First Screen:               Harmony: negative  Anatomic [place]  bilateral CP cysts- resolved @ 27wks, otherwise normal  Glucose Screen  1hr 149, 3hr: 84/147/127/92  GBS Neg 09/01/2012  Feeding Preference breast  Contraception micronor  Circumcision yes

## 2012-09-07 NOTE — Progress Notes (Signed)
cbg 91.    No c/o at this time.  Routine questions about pregnancy andswered.  F/U in 1 weeks for LROB.

## 2012-09-14 ENCOUNTER — Ambulatory Visit (INDEPENDENT_AMBULATORY_CARE_PROVIDER_SITE_OTHER): Payer: Medicaid Other | Admitting: Advanced Practice Midwife

## 2012-09-14 VITALS — BP 118/76 | Wt 202.0 lb

## 2012-09-14 DIAGNOSIS — O9989 Other specified diseases and conditions complicating pregnancy, childbirth and the puerperium: Secondary | ICD-10-CM

## 2012-09-14 DIAGNOSIS — Z1389 Encounter for screening for other disorder: Secondary | ICD-10-CM

## 2012-09-14 DIAGNOSIS — Z3490 Encounter for supervision of normal pregnancy, unspecified, unspecified trimester: Secondary | ICD-10-CM

## 2012-09-14 DIAGNOSIS — Z331 Pregnant state, incidental: Secondary | ICD-10-CM

## 2012-09-14 DIAGNOSIS — Z3483 Encounter for supervision of other normal pregnancy, third trimester: Secondary | ICD-10-CM

## 2012-09-14 DIAGNOSIS — N949 Unspecified condition associated with female genital organs and menstrual cycle: Secondary | ICD-10-CM

## 2012-09-14 LAB — POCT URINALYSIS DIPSTICK
Glucose, UA: NEGATIVE
Ketones, UA: NEGATIVE

## 2012-09-14 NOTE — Patient Instructions (Signed)
Round Ligament Pain  The round ligament is made up of muscle and fibrous tissue. It is attached to the uterus near the fallopian tube. The round ligament is located on both sides of the uterus and helps support the position of the uterus. It usually begins in the second trimester of pregnancy when the uterus comes out of the pelvis. The pain can come and go until the baby is delivered. Round ligament pain is not a serious problem and does not cause harm to the baby.  CAUSE  During pregnancy the uterus grows the most from the second trimester to delivery. As it grows, it stretches and slightly twists the round ligaments. When the uterus leans from one side to the other, the round ligament on the opposite side pulls and stretches. This can cause pain.  SYMPTOMS   Pain can occur on one side or both sides. The pain is usually a short, sharp, and pinching-like. Sometimes it can be a dull, lingering and aching pain. The pain is located in the lower side of the abdomen or in the groin. The pain is internal and usually starts deep in the groin and moves up to the outside of the hip area. Pain can occur with:  · Sudden change in position like getting out of bed or a chair.  · Rolling over in bed.  · Coughing or sneezing.  · Walking too much.  · Any type of physical activity.  DIAGNOSIS   Your caregiver will make sure there are no serious problems causing the pain. When nothing serious is found, the symptoms usually indicate that the pain is from the round ligament.  TREATMENT   · Sit down and relax when the pain starts.  · Flex your knees up to your belly.  · Lay on your side with a pillow under your belly (abdomen) and another one between your legs.  · Sit in a hot bath for 15 to 20 minutes or until the pain goes away.  HOME CARE INSTRUCTIONS   · Only take over-the-counter or prescriptions medicines for pain, discomfort or fever as directed by your caregiver.  · Sit and stand slowly.  · Avoid long walks if it causes  pain.  · Stop or lessen your physical activities if it causes pain.  SEEK MEDICAL CARE IF:   · The pain does not go away with any of your treatment.  · You need stronger medication for the pain.  · You develop back pain that you did not have before with the side pain.  SEEK IMMEDIATE MEDICAL CARE IF:   · You develop a temperature of 102° F (38.9° C) or higher.  · You develop uterine contractions.  · You develop vaginal bleeding.  · You develop nausea, vomiting or diarrhea.  · You develop chills.  · You have pain when you urinate.  Document Released: 03/04/2008 Document Revised: 08/18/2011 Document Reviewed: 03/04/2008  ExitCare® Patient Information ©2013 [employer].

## 2012-09-14 NOTE — Progress Notes (Signed)
Sounds like round ligament pain.  Routine questions about pregnancy answered.  F/U in 1 weeks for LROB.

## 2012-09-14 NOTE — Assessment & Plan Note (Addendum)
Clinic:[facility]  Genetic Screen NT:   icr DSR                 Quad Screen/MSAFP:              HARMONY:  Low risk  Anatomic [place] normal  Glucose Screen 1hr:  149   3hr:  84/147/127/92  GBS neg  Feeding Preference breast  Contraception pill  Circumcision At [facility]

## 2012-09-14 NOTE — Progress Notes (Signed)
Sharp pain on right side extends to groin area.

## 2012-09-17 ENCOUNTER — Inpatient Hospital Stay (HOSPITAL_COMMUNITY)
Admission: AD | Admit: 2012-09-17 | Discharge: 2012-09-17 | Disposition: A | Discharge status: Home / Self Care | Payer: Medicaid Other | Source: Ambulatory Visit | Attending: Obstetrics and Gynecology | Admitting: Obstetrics and Gynecology

## 2012-09-17 ENCOUNTER — Encounter (HOSPITAL_COMMUNITY): Payer: Self-pay | Admitting: *Deleted

## 2012-09-17 DIAGNOSIS — O479 False labor, unspecified: Secondary | ICD-10-CM

## 2012-09-17 DIAGNOSIS — O471 False labor at or after 37 completed weeks of gestation: Secondary | ICD-10-CM

## 2012-09-17 NOTE — MAU Provider Note (Signed)
Chief Complaint:  No chief complaint on file.   First Provider Initiated Contact with Patient 09/17/12 2041     HPI: Rita Oconnor is a 27 y.o. G3P2002 at [redacted]w[redacted]d who presents to maternity admissions reporting having two contractions while out with her family this evening. 2/50 in office last week per pt. Concerned because she lives an hour away. Denies leakage of fluid or vaginal bleeding. Good fetal movement.   Past Medical History: Past Medical History  Diagnosis Date  . Headache     Past obstetric history: OB History   Grav Para Term Preterm Abortions TAB SAB Ect Mult Living   3 2 2       2      # Outc Date GA Lbr Len/2nd Wgt Sex Del Anes PTL Lv   1 TRM 2005 [redacted]w[redacted]d   F SVD EPI  Yes   2 TRM 2009 [redacted]w[redacted]d   M SVD EPI  Yes   3 CUR               Past Surgical History: History reviewed. No pertinent past surgical history.  Family History: Family History  Problem Relation Age of Onset  . Cancer Paternal Aunt     breast  . Coronary artery disease Maternal Grandfather     Social History: History  Substance Use Topics  . Smoking status: Former Smoker    Types: Cigarettes  . Smokeless tobacco: Not on file  . Alcohol Use: No    Allergies: No Known Allergies  Meds:  No prescriptions prior to admission    ROS: Pertinent findings in history of present illness.  Physical Exam  Blood pressure 124/73, pulse 94, temperature 96.8 F (36 C), temperature source Oral, resp. rate 20, height 6' (1.829 m), weight 91.627 kg (202 lb), last menstrual period 11/25/2011. GENERAL: Well-developed, well-nourished female in no acute distress.  HEENT: normocephalic HEART: normal rate RESP: normal effort ABDOMEN: Soft, non-tender, gravid appropriate for gestational age EXTREMITIES: Nontender, no edema NEURO: alert and oriented SPECULUM EXAM: deferred  Dilation: 2 Effacement (%): 70 Station: -2 Presentation: Vertex Exam by:: Mitsuru Dault, CNM  FHT:  Baseline 150 , moderate variability,  accelerations present, no decelerations Contractions: UI   Labs: No results found for this or any previous visit (from the past 24 hour(s)).  Imaging:  No results found. MAU Course: Denies UC's while in MAU. Declines waiting an hour to recheck cervix.   Assessment: 1. False labor after 37 completed weeks of gestation    Plan: Discharge home Labor precautions and fetal kick counts     Follow-up Information   Follow up with [facility]. (as scheduled)    Contact information:   [address] [place] [address] [phone]      Follow up with [facility]. (As needed if symptoms worsen)    Contact information:   [address] [place] [address] [phone]       Medication List    STOP taking these medications       albuterol 108 (90 BASE) MCG/ACT inhaler  Commonly known as:  PROVENTIL HFA;VENTOLIN HFA      TAKE these medications       lansoprazole 15 MG capsule  Commonly known as:  PREVACID  Take 15 mg by mouth daily.     NON FORMULARY  2 oz daily. VEMMA     prenatal multivitamin Tabs  Take 1 tablet by mouth daily at 12 noon.  [place], CNM 09/17/2012 9:05 PM

## 2012-09-17 NOTE — MAU Note (Signed)
PT SAYS SHE WAS IN [place]-  FELT UC'S- HAS BEEN HAVING UC 'S SINCE 430PM.      VE ON Tuesday- 2 CM.   DENIES HSV AND MRSA

## 2012-09-19 NOTE — MAU Provider Note (Signed)
Attestation of Attending Supervision of Advanced Practitioner: Evaluation and management procedures were performed by the PA/NP/CNM/OB Fellow under my supervision/collaboration. Chart reviewed and agree with management and plan.  Zina Pitzer V 09/19/2012 4:52 PM    

## 2012-09-21 ENCOUNTER — Ambulatory Visit (INDEPENDENT_AMBULATORY_CARE_PROVIDER_SITE_OTHER): Payer: Medicaid Other | Admitting: Advanced Practice Midwife

## 2012-09-21 ENCOUNTER — Encounter: Payer: Self-pay | Admitting: Advanced Practice Midwife

## 2012-09-21 VITALS — BP 120/72 | Wt 201.0 lb

## 2012-09-21 DIAGNOSIS — Z331 Pregnant state, incidental: Secondary | ICD-10-CM

## 2012-09-21 DIAGNOSIS — Z3480 Encounter for supervision of other normal pregnancy, unspecified trimester: Secondary | ICD-10-CM

## 2012-09-21 DIAGNOSIS — Z1389 Encounter for screening for other disorder: Secondary | ICD-10-CM

## 2012-09-21 LAB — POCT URINALYSIS DIPSTICK
Ketones, UA: NEGATIVE
Protein, UA: NEGATIVE

## 2012-09-21 NOTE — Progress Notes (Signed)
C/o pain in lower abdomen

## 2012-09-21 NOTE — Progress Notes (Signed)
No c/o at this time.  Routine questions about pregnancy answered.  F/U in 1 weeks for LROB  Membranes swept.

## 2012-09-22 ENCOUNTER — Encounter (HOSPITAL_COMMUNITY): Payer: Self-pay | Admitting: *Deleted

## 2012-09-22 ENCOUNTER — Inpatient Hospital Stay (HOSPITAL_COMMUNITY)
Admission: AD | Admit: 2012-09-22 | Discharge: 2012-09-22 | Disposition: A | Discharge status: Home / Self Care | Payer: Medicaid Other | Source: Ambulatory Visit | Attending: Obstetrics & Gynecology | Admitting: Obstetrics & Gynecology

## 2012-09-22 DIAGNOSIS — O471 False labor at or after 37 completed weeks of gestation: Secondary | ICD-10-CM

## 2012-09-22 DIAGNOSIS — O479 False labor, unspecified: Secondary | ICD-10-CM

## 2012-09-22 LAB — URINALYSIS, ROUTINE W REFLEX MICROSCOPIC
Glucose, UA: NEGATIVE mg/dL
Hgb urine dipstick: NEGATIVE
Specific Gravity, Urine: 1.02 (ref 1.005–1.030)
pH: 6 (ref 5.0–8.0)

## 2012-09-22 LAB — URINE MICROSCOPIC-ADD ON

## 2012-09-22 NOTE — MAU Provider Note (Signed)
History     CSN: [id number]  Arrival date and time: 09/22/12 4098   First Provider Initiated Contact with Patient 09/22/12 2233      Chief Complaint  Patient presents with  . Labor Eval   HPI  Rita Oconnor is a 27 y.o. G3P2002 at [redacted]w[redacted]d who presents today with UCs since about 1530 today. She denies LOF or VB, and confirms FM.   Past Medical History  Diagnosis Date  . Headache     History reviewed. No pertinent past surgical history.  Family History  Problem Relation Age of Onset  . Cancer Paternal Aunt     breast  . Coronary artery disease Maternal Grandfather     History  Substance Use Topics  . Smoking status: Never Smoker   . Smokeless tobacco: Not on file  . Alcohol Use: No    Allergies: No Known Allergies  Prescriptions prior to admission  Medication Sig Dispense Refill  . lansoprazole (PREVACID) 15 MG capsule Take 15 mg by mouth daily.      . NON FORMULARY 2 oz daily. VEMMA        Review of Systems  Constitutional: Negative for fever.  Eyes: Negative for blurred vision.  Gastrointestinal: Negative for nausea, vomiting, abdominal pain, diarrhea and constipation.  Genitourinary: Negative for dysuria, urgency and frequency.  Neurological: Negative for dizziness and headaches.   Physical Exam   Blood pressure 130/81, pulse 91, temperature 97 F (36.1 C), temperature source Oral, resp. rate 20, height 6' (1.829 m), weight 91.173 kg (201 lb), last menstrual period 11/25/2011, SpO2 100.00%.  Physical Exam  Nursing note and vitals reviewed. Constitutional: She is oriented to person, place, and time. She appears well-developed and well-nourished. No distress.  Cardiovascular: Normal rate.   Respiratory: Effort normal.  GI: Soft.  Genitourinary:   Cervix: 4/80/-2 No change from earlier exam about 3 hours ago FHT: CatI Toco: q5 min mild contractions  Neurological: She is alert and oriented to person, place, and time.  Skin: Skin is warm and dry.     MAU Course  Procedures  Results for orders placed during the hospital encounter of 09/22/12 (from the past 24 hour(s))  URINALYSIS, ROUTINE W REFLEX MICROSCOPIC     Status: Abnormal   Collection Time    09/22/12  7:20 PM      Result Value Range   Color, Urine YELLOW  YELLOW   APPearance CLEAR  CLEAR   Specific Gravity, Urine 1.020  1.005 - 1.030   pH 6.0  5.0 - 8.0   Glucose, UA NEGATIVE  NEGATIVE mg/dL   Hgb urine dipstick NEGATIVE  NEGATIVE   Bilirubin Urine NEGATIVE  NEGATIVE   Ketones, ur NEGATIVE  NEGATIVE mg/dL   Protein, ur NEGATIVE  NEGATIVE mg/dL   Urobilinogen, UA 0.2  0.0 - 1.0 mg/dL   Nitrite NEGATIVE  NEGATIVE   Leukocytes, UA TRACE (*) NEGATIVE  URINE MICROSCOPIC-ADD ON     Status: Abnormal   Collection Time    09/22/12  7:20 PM      Result Value Range   Squamous Epithelial / LPF FEW (*) RARE   WBC, UA 0-2  <3 WBC/hpf   RBC / HPF 0-2  <3 RBC/hpf   Bacteria, UA RARE  RARE     Assessment and Plan   1. False labor after 37 completed weeks of gestation    Labor precautions given Knows when to return to MAU FU with FT as scheduled on Tuesday  Tawnya Crook 09/22/2012, 10:39 PM

## 2012-09-22 NOTE — MAU Note (Signed)
Pt state U/C started today at 1530 and become closer together about every 2 minutes.  No ROM or vaginal bleeding reported.  Good fetal movement.

## 2012-09-28 ENCOUNTER — Encounter: Payer: Self-pay | Admitting: Women's Health

## 2012-09-28 ENCOUNTER — Ambulatory Visit (INDEPENDENT_AMBULATORY_CARE_PROVIDER_SITE_OTHER): Payer: Medicaid Other | Admitting: Women's Health

## 2012-09-28 VITALS — BP 116/80 | Wt 205.0 lb

## 2012-09-28 DIAGNOSIS — Z3480 Encounter for supervision of other normal pregnancy, unspecified trimester: Secondary | ICD-10-CM

## 2012-09-28 DIAGNOSIS — Z3483 Encounter for supervision of other normal pregnancy, third trimester: Secondary | ICD-10-CM

## 2012-09-28 DIAGNOSIS — Z331 Pregnant state, incidental: Secondary | ICD-10-CM

## 2012-09-28 DIAGNOSIS — Z1389 Encounter for screening for other disorder: Secondary | ICD-10-CM

## 2012-09-28 LAB — POCT URINALYSIS DIPSTICK
Blood, UA: NEGATIVE
Nitrite, UA: NEGATIVE

## 2012-09-28 NOTE — Progress Notes (Signed)
Reports good fm. Denies regular/painful uc's, lof, vb, urinary frequency, urgency, hesitancy, or dysuria.  Denies ha, scotomata, ruq/epigastric pain, n/v.  States she did have uc's q x 4hrs last wed after membranes swept- but they stopped after she got to whog- and she was d/c'd. No complaints.  Membranes swept again today. Reviewed labor s/s and fetal kick counts. F/u thurs for nst postdates. IOL scheduled for sat 4/26 @ 1930 for postdates. All questions answered.

## 2012-09-28 NOTE — Patient Instructions (Signed)
Braxton Hicks Contractions  Pregnancy is commonly associated with contractions of the uterus throughout the pregnancy. Towards the end of pregnancy (32 to 34 weeks), these contractions (Braxton Hicks) can develop more often and may become more forceful. This is not true labor because these contractions do not result in opening (dilatation) and thinning of the cervix. They are sometimes difficult to tell apart from true labor because these contractions can be forceful and people have different pain tolerances. You should not feel embarrassed if you go to the hospital with false labor. Sometimes, the only way to tell if you are in true labor is for your caregiver to follow the changes in the cervix.  How to tell the difference between true and false labor:  · False labor.  · The contractions of false labor are usually shorter, irregular and not as hard as those of true labor.  · They are often felt in the front of the lower abdomen and in the groin.  · They may leave with walking around or changing positions while lying down.  · They get weaker and are shorter lasting as time goes on.  · These contractions are usually irregular.  · They do not usually become progressively stronger, regular and closer together as with true labor.  · True labor.  · Contractions in true labor last 30 to 70 seconds, become very regular, usually become more intense, and increase in frequency.  · They do not go away with walking.  · The discomfort is usually felt in the top of the uterus and spreads to the lower abdomen and low back.  · True labor can be determined by your caregiver with an exam. This will show that the cervix is dilating and getting thinner.  If there are no prenatal problems or other health problems associated with the pregnancy, it is completely safe to be sent home with false labor and await the onset of true labor.  HOME CARE INSTRUCTIONS   · Keep up with your usual exercises and instructions.  · Take medications as  directed.  · Keep your regular prenatal appointment.  · Eat and drink lightly if you think you are going into labor.  · If BH contractions are making you uncomfortable:  · Change your activity position from lying down or resting to walking/walking to resting.  · Sit and rest in a tub of warm water.  · Drink 2 to 3 glasses of water. Dehydration may cause B-H contractions.  · Do slow and deep breathing several times an hour.  SEEK IMMEDIATE MEDICAL CARE IF:   · Your contractions continue to become stronger, more regular, and closer together.  · You have a gushing, burst or leaking of fluid from the vagina.  · An oral temperature above 102° F (38.9° C) develops.  · You have passage of blood-tinged mucus.  · You develop vaginal bleeding.  · You develop continuous belly (abdominal) pain.  · You have low back pain that you never had before.  · You feel the baby's head pushing down causing pelvic pressure.  · The baby is not moving as much as it used to.  Document Released: 05/26/2005 Document Revised: 08/18/2011 Document Reviewed: 11/17/2008  ExitCare® Patient Information ©2013 [employer].

## 2012-09-29 ENCOUNTER — Telehealth (HOSPITAL_COMMUNITY): Payer: Self-pay | Admitting: *Deleted

## 2012-09-29 ENCOUNTER — Encounter (HOSPITAL_COMMUNITY): Payer: Self-pay | Admitting: *Deleted

## 2012-09-29 NOTE — Telephone Encounter (Signed)
Preadmission screen  

## 2012-09-30 ENCOUNTER — Ambulatory Visit: Payer: Medicaid Other | Admitting: Obstetrics & Gynecology

## 2012-09-30 ENCOUNTER — Ambulatory Visit (INDEPENDENT_AMBULATORY_CARE_PROVIDER_SITE_OTHER): Payer: Medicaid Other | Admitting: Obstetrics & Gynecology

## 2012-09-30 ENCOUNTER — Encounter: Payer: Self-pay | Admitting: Obstetrics & Gynecology

## 2012-09-30 VITALS — BP 130/80 | Wt 205.0 lb

## 2012-09-30 DIAGNOSIS — Z1389 Encounter for screening for other disorder: Secondary | ICD-10-CM

## 2012-09-30 DIAGNOSIS — Z331 Pregnant state, incidental: Secondary | ICD-10-CM

## 2012-09-30 DIAGNOSIS — O480 Post-term pregnancy: Secondary | ICD-10-CM

## 2012-09-30 LAB — POCT URINALYSIS DIPSTICK
Blood, UA: NEGATIVE
Glucose, UA: NEGATIVE

## 2012-09-30 NOTE — Progress Notes (Signed)
Reactive NST.  Scheduled for induction Saturday. BP weight and urine results all reviewed and noted. Patient reports good fetal movement, denies any bleeding and no rupture of membranes symptoms or regular contractions. Patient is without complaints. All questions were answered. Induction for 4.26.2014 post dates

## 2012-10-02 ENCOUNTER — Inpatient Hospital Stay (HOSPITAL_COMMUNITY): Payer: Medicaid Other | Admitting: Anesthesiology

## 2012-10-02 ENCOUNTER — Encounter (HOSPITAL_COMMUNITY): Payer: Self-pay

## 2012-10-02 ENCOUNTER — Inpatient Hospital Stay (HOSPITAL_COMMUNITY)
Admission: RE | Admit: 2012-10-02 | Discharge: 2012-10-04 | DRG: 775 | Disposition: A | Discharge status: Home / Self Care | Payer: Medicaid Other | Source: Ambulatory Visit | Attending: Obstetrics & Gynecology | Admitting: Obstetrics & Gynecology

## 2012-10-02 ENCOUNTER — Encounter (HOSPITAL_COMMUNITY): Payer: Self-pay | Admitting: Anesthesiology

## 2012-10-02 DIAGNOSIS — Z3483 Encounter for supervision of other normal pregnancy, third trimester: Secondary | ICD-10-CM

## 2012-10-02 DIAGNOSIS — O480 Post-term pregnancy: Principal | ICD-10-CM | POA: Diagnosis present

## 2012-10-02 LAB — TYPE AND SCREEN
ABO/RH(D): O POS
Antibody Screen: NEGATIVE

## 2012-10-02 LAB — CBC
Hemoglobin: 9.5 g/dL — ABNORMAL LOW (ref 12.0–15.0)
MCH: 29.6 pg (ref 26.0–34.0)
Platelets: 183 10*3/uL (ref 150–400)
RBC: 3.21 MIL/uL — ABNORMAL LOW (ref 3.87–5.11)
WBC: 10.1 10*3/uL (ref 4.0–10.5)

## 2012-10-02 LAB — RPR: RPR Ser Ql: NONREACTIVE

## 2012-10-02 MED ORDER — LIDOCAINE HCL (PF) 1 % IJ SOLN
INTRAMUSCULAR | Status: DC | PRN
  Administered 2012-10-02: 5 mL
  Administered 2012-10-02: 4 mL

## 2012-10-02 MED ORDER — EPHEDRINE 5 MG/ML INJ: 10.0000 mg | INTRAVENOUS | Status: DC | PRN

## 2012-10-02 MED ORDER — FLEET ENEMA 7-19 GM/118ML RE ENEM: 1.0000 | ENEMA | RECTAL | Status: DC | PRN

## 2012-10-02 MED ORDER — LACTATED RINGERS IV SOLN: 500.0000 mL | INTRAVENOUS | Status: DC | PRN

## 2012-10-02 MED ORDER — ONDANSETRON HCL 4 MG/2ML IJ SOLN: 4.0000 mg | Freq: Four times a day (QID) | INTRAMUSCULAR | Status: DC | PRN

## 2012-10-02 MED ORDER — LIDOCAINE HCL (PF) 1 % IJ SOLN
30.0000 mL | INTRAMUSCULAR | Status: DC | PRN
  Filled 2012-10-02 (×2): qty 30

## 2012-10-02 MED ORDER — FENTANYL 2.5 MCG/ML BUPIVACAINE 1/10 % EPIDURAL INFUSION (WH - ANES)
14.0000 mL/h | INTRAMUSCULAR | Status: DC | PRN
  Filled 2012-10-02: qty 125

## 2012-10-02 MED ORDER — ACETAMINOPHEN 325 MG PO TABS
650.0000 mg | ORAL_TABLET | ORAL | Status: DC | PRN
  Administered 2012-10-02: 650 mg via ORAL
  Filled 2012-10-02: qty 2

## 2012-10-02 MED ORDER — PHENYLEPHRINE 40 MCG/ML (10ML) SYRINGE FOR IV PUSH (FOR BLOOD PRESSURE SUPPORT)
80.0000 ug | PREFILLED_SYRINGE | INTRAVENOUS | Status: DC | PRN
  Filled 2012-10-02: qty 5

## 2012-10-02 MED ORDER — LACTATED RINGERS IV SOLN
INTRAVENOUS | Status: DC
  Administered 2012-10-02: 20:00:00 via INTRAVENOUS

## 2012-10-02 MED ORDER — OXYTOCIN BOLUS FROM INFUSION: 500.0000 mL | INTRAVENOUS | Status: DC

## 2012-10-02 MED ORDER — OXYTOCIN 40 UNITS IN LACTATED RINGERS INFUSION - SIMPLE MED
62.5000 mL/h | INTRAVENOUS | Status: DC
  Administered 2012-10-03: 62.5 mL/h via INTRAVENOUS
  Filled 2012-10-02: qty 1000

## 2012-10-02 MED ORDER — FENTANYL 2.5 MCG/ML BUPIVACAINE 1/10 % EPIDURAL INFUSION (WH - ANES)
INTRAMUSCULAR | Status: DC | PRN
  Administered 2012-10-02: 17 mL/h via EPIDURAL

## 2012-10-02 MED ORDER — CITRIC ACID-SODIUM CITRATE 334-500 MG/5ML PO SOLN: 30.0000 mL | ORAL | Status: DC | PRN

## 2012-10-02 MED ORDER — PHENYLEPHRINE 40 MCG/ML (10ML) SYRINGE FOR IV PUSH (FOR BLOOD PRESSURE SUPPORT): 80.0000 ug | PREFILLED_SYRINGE | INTRAVENOUS | Status: DC | PRN

## 2012-10-02 MED ORDER — OXYCODONE-ACETAMINOPHEN 5-325 MG PO TABS: 1.0000 | ORAL_TABLET | ORAL | Status: DC | PRN

## 2012-10-02 MED ORDER — EPHEDRINE 5 MG/ML INJ
10.0000 mg | INTRAVENOUS | Status: DC | PRN
  Filled 2012-10-02: qty 4

## 2012-10-02 MED ORDER — IBUPROFEN 600 MG PO TABS
600.0000 mg | ORAL_TABLET | Freq: Four times a day (QID) | ORAL | Status: DC | PRN
  Filled 2012-10-02 (×4): qty 1

## 2012-10-02 MED ORDER — DIPHENHYDRAMINE HCL 50 MG/ML IJ SOLN: 12.5000 mg | INTRAMUSCULAR | Status: DC | PRN

## 2012-10-02 MED ORDER — LACTATED RINGERS IV SOLN
500.0000 mL | Freq: Once | INTRAVENOUS | Status: AC
  Administered 2012-10-02: 500 mL via INTRAVENOUS

## 2012-10-02 NOTE — H&P (Signed)
Rita Oconnor is a 27 y.o.G3P2002  female at [redacted]w[redacted]d by 12.2wk u/s, presenting for IOL d/t postdates.  Initiated pnc @ FT @ 12.2wks. Integrated screening showed increased r/f DS, Harmony neg <1:10,000.  Anatomy u/s @ 17wks revealed bilateral CP cysts and clenched fists, but otherwise normal.  Both resolved at 27wks.  1hr gtt 149, 3hr normal 84/147/127/92.  GBS neg. H/O 2 previous SVD @ term w/ rapid labors, both 7lb 6oz, last one in 2009.   Maternal Medical History:  Reason for admission: IOL postdates  Fetal activity: Perceived fetal activity is normal.   Last perceived fetal movement was within the past hour.    Prenatal complications: no prenatal complications Prenatal Complications - Diabetes: none.    OB History   Grav Para Term Preterm Abortions TAB SAB Ect Mult Living   3 2 2       2      Past Medical History  Diagnosis Date  . Headache    No past surgical history on file. Family History: family history includes Cancer in her paternal aunt and Coronary artery disease in her maternal grandfather. Social History:  reports that she has never smoked. She does not have any smokeless tobacco history on file. She reports that she does not drink alcohol or use illicit drugs.  Review of Systems  Constitutional: Negative.   HENT: Negative.   Eyes: Negative.   Respiratory: Negative.   Cardiovascular: Negative.   Gastrointestinal: Negative.   Genitourinary: Negative.   Musculoskeletal: Negative.   Skin: Negative.   Neurological: Negative.   Endo/Heme/Allergies: Negative.   Psychiatric/Behavioral: Negative.       Last menstrual period 11/25/2011. Maternal Exam:  Uterine Assessment: Contraction strength is mild.  Contraction frequency is irregular.   Abdomen: Fetal presentation: vertex  Introitus: Normal vulva. Normal vagina.  Pelvis: adequate for delivery.   Cervix: Cervix evaluated by digital exam.     Fetal Exam Fetal Monitor Review: Mode: ultrasound.   Baseline rate:  135.  Variability: moderate (6-25 bpm).   Pattern: accelerations present and no decelerations.    Fetal State Assessment: Category I - tracings are normal.     Physical Exam  Constitutional: She is oriented to person, place, and time. She appears well-developed and well-nourished.  HENT:  Head: Normocephalic.  Neck: Normal range of motion.  Cardiovascular: Normal rate and regular rhythm.   Respiratory: Effort normal and breath sounds normal.  GI: Soft.  gravid  Genitourinary: Vagina normal and uterus normal.  SVE: 4.5/80/-1to0, vtx, arom small amount clear fluid   Musculoskeletal: Normal range of motion.  Neurological: She is alert and oriented to person, place, and time. She has normal reflexes.  Skin: Skin is warm and dry.  Psychiatric: She has a normal mood and affect. Her behavior is normal. Judgment and thought content normal.    Prenatal labs: ABO, Rh: O/Positive/-- (10/07 0000) Antibody: Negative (10/07 0000) Rubella: Immune (10/07 0000) RPR: Nonreactive (10/07 0000)  HBsAg: Negative (10/07 0000)  HIV: Non-reactive (10/07 0000)  GBS: Negative (03/28 0000)   Assessment/Plan: A:  [redacted]w[redacted]d SIUP  IOL d/t postdates  GBS neg  Cat I FHR   P:  Admit to BS  IV pain meds/epidural prn  AROM'd  Anticipate NSVD     [name] 10/02/2012, 7:37 PM

## 2012-10-02 NOTE — Anesthesia Procedure Notes (Signed)
Epidural Patient location during procedure: OB Start time: 10/02/2012 10:55 PM  Staffing Anesthesiologist: Jamus Loving A. Performed by: anesthesiologist   Preanesthetic Checklist Completed: patient identified, site marked, surgical consent, pre-op evaluation, timeout performed, IV checked, risks and benefits discussed and monitors and equipment checked  Epidural Patient position: sitting Prep: site prepped and draped and DuraPrep Patient monitoring: continuous pulse ox and blood pressure Approach: midline Injection technique: LOR air  Needle:  Needle type: Tuohy  Needle gauge: 17 G Needle length: 9 cm and 9 Needle insertion depth: 5 cm cm Catheter type: closed end flexible Catheter size: 19 Gauge Catheter at skin depth: 10 cm Test dose: negative and Other  Assessment Events: blood not aspirated, injection not painful, no injection resistance, negative IV test and no paresthesia  Additional Notes Patient identified. Risks and benefits discussed including failed block, incomplete  Pain control, post dural puncture headache, nerve damage, paralysis, blood pressure Changes, nausea, vomiting, reactions to medications-both toxic and allergic and post Partum back pain. All questions were answered. Patient expressed understanding and wished to proceed. Sterile technique was used throughout procedure. Epidural site was Dressed with sterile barrier dressing. No paresthesias, signs of intravascular injection Or signs of intrathecal spread were encountered.  Patient was more comfortable after the epidural was dosed. Please see RN's note for documentation of vital signs and FHR which are stable.

## 2012-10-02 NOTE — Anesthesia Preprocedure Evaluation (Signed)
Anesthesia Evaluation  Patient identified by MRN, date of birth, ID band Patient awake    Reviewed: Allergy & Precautions, H&P , Patient's Chart, lab work & pertinent test results  Airway Mallampati: III TM Distance: >3 FB Neck ROM: full    Dental no notable dental hx. (+) Teeth Intact   Pulmonary neg pulmonary ROS,  breath sounds clear to auscultation  Pulmonary exam normal       Cardiovascular negative cardio ROS  Rhythm:regular Rate:Normal     Neuro/Psych  Headaches, negative psych ROS   GI/Hepatic Neg liver ROS, GERD-  Medicated and Controlled,  Endo/Other  negative endocrine ROS  Renal/GU negative Renal ROS  negative genitourinary   Musculoskeletal   Abdominal Normal abdominal exam  (+)   Peds  Hematology negative hematology ROS (+)   Anesthesia Other Findings   Reproductive/Obstetrics (+) Pregnancy                           Anesthesia Physical Anesthesia Plan  ASA: II  Anesthesia Plan: Epidural   Post-op Pain Management:    Induction:   Airway Management Planned:   Additional Equipment:   Intra-op Plan:   Post-operative Plan:   Informed Consent: I have reviewed the patients History and Physical, chart, labs and discussed the procedure including the risks, benefits and alternatives for the proposed anesthesia with the patient or authorized representative who has indicated his/her understanding and acceptance.     Plan Discussed with: Anesthesiologist  Anesthesia Plan Comments:         Anesthesia Quick Evaluation   

## 2012-10-03 ENCOUNTER — Encounter (HOSPITAL_COMMUNITY): Payer: Self-pay

## 2012-10-03 DIAGNOSIS — O480 Post-term pregnancy: Secondary | ICD-10-CM

## 2012-10-03 LAB — ABO/RH: ABO/RH(D): O POS

## 2012-10-03 MED ORDER — OXYCODONE-ACETAMINOPHEN 5-325 MG PO TABS: 1.0000 | ORAL_TABLET | ORAL | Status: DC | PRN

## 2012-10-03 MED ORDER — OXYTOCIN 40 UNITS IN LACTATED RINGERS INFUSION - SIMPLE MED: 62.5000 mL/h | INTRAVENOUS | Status: DC | PRN

## 2012-10-03 MED ORDER — IBUPROFEN 600 MG PO TABS
600.0000 mg | ORAL_TABLET | Freq: Four times a day (QID) | ORAL | Status: DC
  Administered 2012-10-03 – 2012-10-04 (×5): 600 mg via ORAL
  Filled 2012-10-03 (×2): qty 1

## 2012-10-03 MED ORDER — LANOLIN HYDROUS EX OINT: TOPICAL_OINTMENT | CUTANEOUS | Status: DC | PRN

## 2012-10-03 MED ORDER — PRENATAL MULTIVITAMIN CH
1.0000 | ORAL_TABLET | Freq: Every day | ORAL | Status: DC
  Administered 2012-10-03: 1 via ORAL
  Filled 2012-10-03: qty 1

## 2012-10-03 MED ORDER — FENTANYL CITRATE 0.05 MG/ML IJ SOLN: 100.0000 ug | INTRAMUSCULAR | Status: DC | PRN

## 2012-10-03 MED ORDER — SIMETHICONE 80 MG PO CHEW: 80.0000 mg | CHEWABLE_TABLET | ORAL | Status: DC | PRN

## 2012-10-03 MED ORDER — PANTOPRAZOLE SODIUM 40 MG PO TBEC
40.0000 mg | DELAYED_RELEASE_TABLET | Freq: Every day | ORAL | Status: DC
  Filled 2012-10-03 (×2): qty 1

## 2012-10-03 MED ORDER — SODIUM CHLORIDE 0.9 % IJ SOLN: 3.0000 mL | INTRAMUSCULAR | Status: DC | PRN

## 2012-10-03 MED ORDER — SODIUM CHLORIDE 0.9 % IV SOLN: 250.0000 mL | INTRAVENOUS | Status: DC | PRN

## 2012-10-03 MED ORDER — BISACODYL 10 MG RE SUPP: 10.0000 mg | Freq: Every day | RECTAL | Status: DC | PRN

## 2012-10-03 MED ORDER — ONDANSETRON HCL 4 MG/2ML IJ SOLN: 4.0000 mg | INTRAMUSCULAR | Status: DC | PRN

## 2012-10-03 MED ORDER — SENNOSIDES-DOCUSATE SODIUM 8.6-50 MG PO TABS
2.0000 | ORAL_TABLET | Freq: Every day | ORAL | Status: DC
  Administered 2012-10-03: 2 via ORAL

## 2012-10-03 MED ORDER — WITCH HAZEL-GLYCERIN EX PADS: 1.0000 "application " | MEDICATED_PAD | CUTANEOUS | Status: DC | PRN

## 2012-10-03 MED ORDER — FLEET ENEMA 7-19 GM/118ML RE ENEM: 1.0000 | ENEMA | Freq: Every day | RECTAL | Status: DC | PRN

## 2012-10-03 MED ORDER — SODIUM CHLORIDE 0.9 % IJ SOLN
3.0000 mL | Freq: Two times a day (BID) | INTRAMUSCULAR | Status: DC
  Administered 2012-10-03: 3 mL via INTRAVENOUS

## 2012-10-03 MED ORDER — BENZOCAINE-MENTHOL 20-0.5 % EX AERO
1.0000 "application " | INHALATION_SPRAY | CUTANEOUS | Status: DC | PRN
  Administered 2012-10-03: 1 via TOPICAL
  Filled 2012-10-03: qty 56

## 2012-10-03 MED ORDER — DIBUCAINE 1 % RE OINT: 1.0000 "application " | TOPICAL_OINTMENT | RECTAL | Status: DC | PRN

## 2012-10-03 MED ORDER — TETANUS-DIPHTH-ACELL PERTUSSIS 5-2.5-18.5 LF-MCG/0.5 IM SUSP: 0.5000 mL | Freq: Once | INTRAMUSCULAR | Status: DC

## 2012-10-03 MED ORDER — DIPHENHYDRAMINE HCL 25 MG PO CAPS
25.0000 mg | ORAL_CAPSULE | Freq: Four times a day (QID) | ORAL | Status: DC | PRN
  Administered 2012-10-03: 25 mg via ORAL
  Filled 2012-10-03: qty 1

## 2012-10-03 MED ORDER — ZOLPIDEM TARTRATE 5 MG PO TABS: 5.0000 mg | ORAL_TABLET | Freq: Every evening | ORAL | Status: DC | PRN

## 2012-10-03 MED ORDER — ONDANSETRON HCL 4 MG PO TABS: 4.0000 mg | ORAL_TABLET | ORAL | Status: DC | PRN

## 2012-10-03 MED ORDER — MEASLES, MUMPS & RUBELLA VAC ~~LOC~~ INJ
0.5000 mL | INJECTION | Freq: Once | SUBCUTANEOUS | Status: DC
  Filled 2012-10-03: qty 0.5

## 2012-10-03 NOTE — Anesthesia Postprocedure Evaluation (Signed)
  Anesthesia Post-op Note  Patient: Rita Oconnor  Procedure(s) Performed: * No procedures listed *  Patient Location: Mother/Baby  Anesthesia Type:Epidural  Level of Consciousness: awake and alert   Airway and Oxygen Therapy: Patient Spontanous Breathing  Post-op Pain: mild  Post-op Assessment: Patient's Cardiovascular Status Stable, Respiratory Function Stable, No signs of Nausea or vomiting, Pain level controlled, No backache, No residual numbness and No residual motor weakness  Post-op Vital Signs: Reviewed and stable  Complications: No apparent anesthesia complications

## 2012-10-03 NOTE — Progress Notes (Signed)
Rita Oconnor is a 27 y.o. G3P2002 at [redacted]w[redacted]d admitted for induction of labor due to postdates.  Subjective: Comfortable w/ epidural, no report of pelvic/rectal pressure  Objective: BP 122/69  Pulse 80  Temp(Src) 98.5 F (36.9 C) (Oral)  Resp 18  Ht 6' (1.829 m)  Wt 92.987 kg (205 lb)  BMI 27.8 kg/m2  SpO2 100%  LMP 11/25/2011      FHT:  FHR: 135 bpm, variability: moderate,  accelerations:  Present,  decelerations:  Present occ variable UC:   regular, every 2-3 minutes SVE:   Dilation: Lip/rim Effacement (%): 100 Station: +2 Exam by:: a. white rn  Labs: Lab Results  Component Value Date   WBC 10.1 10/02/2012   HGB 9.5* 10/02/2012   HCT 29.3* 10/02/2012   MCV 91.3 10/02/2012   PLT 183 10/02/2012    Assessment / Plan: IOL d/t postdates, progressing well s/p arom  Labor: Progressing normally Preeclampsia:  n/a Fetal Wellbeing:  Category II Pain Control:  Epidural I/D:  n/a Anticipated MOD:  NSVD  Rita Oconnor 10/03/2012, 12:44 AM

## 2012-10-04 NOTE — Progress Notes (Signed)
UR chart review completed.  

## 2012-10-04 NOTE — Discharge Summary (Signed)
Obstetric Discharge Summary Reason for Admission: onset of labor Prenatal Procedures: ultrasound Intrapartum Procedures: spontaneous vaginal delivery Postpartum Procedures: none Complications-Operative and Postpartum: none Hemoglobin  Date Value Range Status  10/02/2012 9.5* 12.0 - 15.0 g/dL Final  09/15/8117 14.7   Final     HCT  Date Value Range Status  10/02/2012 29.3* 36.0 - 46.0 % Final  06/30/2012 32   Final    Physical Exam:  General: alert and no distress Lochia: appropriate Uterine Fundus: firm Incision: NA  DVT Evaluation: No evidence of DVT seen on physical exam.  Discharge Diagnoses: Term Pregnancy-delivered  Discharge Information: Date: 10/04/2012 Activity: unrestricted Diet: routine Medications: Ibuprofen Condition: stable Instructions: refer to practice specific booklet Discharge to: home Follow-up Information   Follow up with [facility] In 6 weeks.   Contact information:   [address] [place] [address] [phone]      Newborn Data: Live born female  Birth Weight: 7 lb 15 oz (3600 g) APGAR: 8, 9  Home with mother.  Tawnya Crook 10/04/2012, 7:16 AM

## 2012-11-11 ENCOUNTER — Encounter: Payer: Self-pay | Admitting: Obstetrics & Gynecology

## 2012-11-11 ENCOUNTER — Ambulatory Visit (INDEPENDENT_AMBULATORY_CARE_PROVIDER_SITE_OTHER): Payer: Medicaid Other | Admitting: Obstetrics & Gynecology

## 2012-11-11 MED ORDER — NORETHINDRONE 0.35 MG PO TABS: 1.0000 | ORAL_TABLET | Freq: Every day | ORAL | Status: DC

## 2012-11-11 NOTE — Progress Notes (Signed)
Patient ID: Rita Oconnor, female   DOB: 11-16-1985, 27 y.o.   MRN: [id number] Subjective:     Rita Oconnor is a 27 y.o. female who presents for a postpartum visit. She is 6 weeks postpartum following a spontaneous vaginal delivery. I have fully reviewed the prenatal and intrapartum course. The delivery was at 41 gestational weeks. Outcome: spontaneous vaginal delivery. Anesthesia: epidural. Postpartum course has been normal. Baby's course has been normal. Baby is feeding by breast. Bleeding no bleeding. Bowel function is normal. Bladder function is normal. Patient is not sexually active. Contraception method is none. Postpartum depression screening: negative.  The following portions of the patient's history were reviewed and updated as appropriate: allergies, current medications, past family history, past medical history, past social history, past surgical history and problem list.  Review of Systems Pertinent items are noted in HPI.   Objective:    BP 110/80  Wt 188 lb (85.276 kg)  BMI 25.49 kg/m2  General:  alert, cooperative and no distress   Breasts:  negative  Lungs:   Heart:    Abdomen: soft, non-tender; bowel sounds normal; no masses,  no organomegaly   Vulva:  normal  Vagina: normal vagina  Cervix:  normal  Corpus: normal size, contour, position, consistency, mobility, non-tender  Adnexa:  normal adnexa  Rectal Exam: Not performed.        Assessment:     Normal postpartum exam. Pap smear not done at today's visit.   Plan:    1. Contraception: micronor 2.  3. Follow up in: prn months or as needed.

## 2013-12-26 ENCOUNTER — Other Ambulatory Visit: Payer: Self-pay | Admitting: Obstetrics and Gynecology

## 2013-12-26 DIAGNOSIS — O3680X0 Pregnancy with inconclusive fetal viability, not applicable or unspecified: Secondary | ICD-10-CM

## 2013-12-28 ENCOUNTER — Ambulatory Visit (INDEPENDENT_AMBULATORY_CARE_PROVIDER_SITE_OTHER): Payer: Medicaid Other

## 2013-12-28 DIAGNOSIS — O3680X0 Pregnancy with inconclusive fetal viability, not applicable or unspecified: Secondary | ICD-10-CM

## 2013-12-28 NOTE — Progress Notes (Signed)
U/S-single IUP with +FCA noted, FHR-170 bpm, CRL c/w 9+2wks EDD 07/31/2014, cx appears closed, bilateral adnexa appears WNL

## 2014-01-05 ENCOUNTER — Telehealth: Payer: Self-pay | Admitting: Advanced Practice Midwife

## 2014-01-05 NOTE — Telephone Encounter (Signed)
I have been unable to speak with the pt after a couple attempts. I did speak to JAG about the pt though and she advised that the pt would need to come and have a Parvo B19 drawn. I will continue to reach the pt.

## 2014-01-09 NOTE — Telephone Encounter (Signed)
Pt states that the doctor stated that her 3015 month old had all the symptoms of 5th disease but they were not 100% sure that's what it was. I advised the pt that there was a blood test that we could draw but if she had been exposed then she had just been exposed there was really nothing that you could do about it. The pt stated that she was not going to worry about it because the doctor wasn't completely sure of what her son had.

## 2014-01-10 ENCOUNTER — Encounter: Payer: Medicaid Other | Admitting: Women's Health

## 2014-01-11 ENCOUNTER — Encounter: Payer: Self-pay | Admitting: Women's Health

## 2014-01-11 ENCOUNTER — Ambulatory Visit (INDEPENDENT_AMBULATORY_CARE_PROVIDER_SITE_OTHER): Payer: Medicaid Other | Admitting: Women's Health

## 2014-01-11 VITALS — BP 122/78 | Ht 71.0 in | Wt 188.0 lb

## 2014-01-11 DIAGNOSIS — Z1389 Encounter for screening for other disorder: Secondary | ICD-10-CM

## 2014-01-11 DIAGNOSIS — Z0189 Encounter for other specified special examinations: Secondary | ICD-10-CM

## 2014-01-11 DIAGNOSIS — Z0184 Encounter for antibody response examination: Secondary | ICD-10-CM

## 2014-01-11 DIAGNOSIS — Z331 Pregnant state, incidental: Secondary | ICD-10-CM

## 2014-01-11 DIAGNOSIS — Z3481 Encounter for supervision of other normal pregnancy, first trimester: Secondary | ICD-10-CM

## 2014-01-11 DIAGNOSIS — Z1159 Encounter for screening for other viral diseases: Secondary | ICD-10-CM

## 2014-01-11 DIAGNOSIS — Z3480 Encounter for supervision of other normal pregnancy, unspecified trimester: Secondary | ICD-10-CM

## 2014-01-11 LAB — POCT URINALYSIS DIPSTICK
GLUCOSE UA: NEGATIVE
KETONES UA: NEGATIVE
Leukocytes, UA: NEGATIVE
Nitrite, UA: NEGATIVE
Protein, UA: NEGATIVE
RBC UA: NEGATIVE

## 2014-01-11 LAB — CBC
HEMATOCRIT: 36.2 % (ref 36.0–46.0)
HEMOGLOBIN: 12.1 g/dL (ref 12.0–15.0)
MCH: 30.6 pg (ref 26.0–34.0)
MCHC: 33.4 g/dL (ref 30.0–36.0)
MCV: 91.4 fL (ref 78.0–100.0)
PLATELETS: 247 10*3/uL (ref 150–400)
RBC: 3.96 MIL/uL (ref 3.87–5.11)
RDW: 14.4 % (ref 11.5–15.5)
WBC: 9.8 10*3/uL (ref 4.0–10.5)

## 2014-01-11 NOTE — Progress Notes (Signed)
  Subjective:  Rita Oconnor is a 28 y.o. 894P3003 Caucasian female at 3334w2d by 9wk u/s, being seen today for her first obstetrical visit.  Her obstetrical history is significant for term uncomplicated SVD x 3.  Pregnancy history fully reviewed.  Patient reports some nausea, no vomiting- declines need for meds at this time. Denies vb, cramping, uti s/s, abnormal/malodorous vag d/c, or vulvovaginal itching/irritation.  BP 122/78  Wt 188 lb (85.276 kg)  HISTORY: OB History  Gravida Para Term Preterm AB SAB TAB Ectopic Multiple Living  4 3 3       3     # Outcome Date GA Lbr Len/2nd Weight Sex Delivery Anes PTL Lv  4 CUR           3 TRM 10/03/12 598w1d 249:20 / 00:20 7 lb 15 oz (3.6 kg) M SVD EPI  Y  2 TRM 2009 8453w0d 05:00 7 lb 6 oz (3.345 kg) M SVD EPI  Y  1 TRM 2005 5892w0d 05:00 7 lb 6 oz (3.345 kg) F SVD EPI  Y     Past Medical History  Diagnosis Date  . Headache(784.0)    History reviewed. No pertinent past surgical history. Family History  Problem Relation Age of Onset  . Cancer Paternal Aunt     breast  . Coronary artery disease Maternal Grandfather     Exam   System:     General: Well developed & nourished, no acute distress   Skin: Warm & dry, normal coloration and turgor, no rashes   Neurologic: Alert & oriented, normal mood   Cardiovascular: Regular rate & rhythm   Respiratory: Effort & rate normal, LCTAB, acyanotic   Abdomen: Soft, non tender   Extremities: normal strength, tone   Pelvic Exam:    Perineum: Normal perineum   Vulva: Normal, no lesions   Vagina:  Normal mucosa, normal discharge   Cervix: Normal, bulbous, appears closed   Uterus: Normal size/shape/contour for GA   Thin prep pap smear: oct 2014 @ [facility], normal per pt  FHR: 168 via doppler   Assessment:   Pregnancy: [id number] Patient Active Problem List   Diagnosis Date Noted  . Supervision of other normal pregnancy 08/31/2012    5334w2d G4P3003 New OB visit Nausea of pregnancy   Plan:   Initial labs drawn Continue prenatal vitamins Problem list reviewed and updated Reviewed n/v relief measures and warning s/s to report Reviewed recommended weight gain based on pre-gravid BMI Encouraged well-balanced diet Genetic Screening discussed Integrated Screen: declined Cystic fibrosis screening discussed declined Ultrasound discussed; fetal survey: requested Follow up in 4 weeks for visit CCNC completed  Marge DuncansBooker, Servando Kyllonen Randall CNM, [facility] Hospital[facility]NP-BC 01/11/2014 9:10 AM

## 2014-01-11 NOTE — Patient Instructions (Signed)

## 2014-01-12 LAB — URINALYSIS, ROUTINE W REFLEX MICROSCOPIC
BILIRUBIN URINE: NEGATIVE
GLUCOSE, UA: NEGATIVE mg/dL
HGB URINE DIPSTICK: NEGATIVE
Ketones, ur: NEGATIVE mg/dL
Leukocytes, UA: NEGATIVE
Nitrite: NEGATIVE
PROTEIN: NEGATIVE mg/dL
Specific Gravity, Urine: 1.024 (ref 1.005–1.030)
UROBILINOGEN UA: 0.2 mg/dL (ref 0.0–1.0)
pH: 5.5 (ref 5.0–8.0)

## 2014-01-12 LAB — ABO AND RH: RH TYPE: POSITIVE

## 2014-01-12 LAB — DRUG SCREEN, URINE, NO CONFIRMATION
Amphetamine Screen, Ur: NEGATIVE
Barbiturate Quant, Ur: NEGATIVE
Benzodiazepines.: NEGATIVE
CREATININE, U: 137.8 mg/dL
Cocaine Metabolites: NEGATIVE
MARIJUANA METABOLITE: NEGATIVE
METHADONE: NEGATIVE
Opiate Screen, Urine: NEGATIVE
PHENCYCLIDINE (PCP): NEGATIVE
PROPOXYPHENE: NEGATIVE

## 2014-01-12 LAB — ANTIBODY SCREEN: ANTIBODY SCREEN: NEGATIVE

## 2014-01-12 LAB — HEPATITIS B SURFACE ANTIGEN: Hepatitis B Surface Ag: NEGATIVE

## 2014-01-12 LAB — OXYCODONE SCREEN, UA, RFLX CONFIRM: Oxycodone Screen, Ur: NEGATIVE ng/mL

## 2014-01-12 LAB — VARICELLA ZOSTER ANTIBODY, IGG: Varicella IgG: 1213 Index — ABNORMAL HIGH (ref <135.00)

## 2014-01-12 LAB — GC/CHLAMYDIA PROBE AMP
CT Probe RNA: NEGATIVE
GC PROBE AMP APTIMA: NEGATIVE

## 2014-01-12 LAB — RPR

## 2014-01-12 LAB — URINE CULTURE
Colony Count: NO GROWTH
Organism ID, Bacteria: NO GROWTH

## 2014-01-12 LAB — HIV ANTIBODY (ROUTINE TESTING W REFLEX): HIV 1&2 Ab, 4th Generation: NONREACTIVE

## 2014-01-12 LAB — RUBELLA SCREEN: RUBELLA: 11 {index} — AB (ref <0.90)

## 2014-01-16 ENCOUNTER — Encounter: Payer: Self-pay | Admitting: Women's Health

## 2014-02-08 ENCOUNTER — Encounter: Payer: Self-pay | Admitting: Advanced Practice Midwife

## 2014-02-08 ENCOUNTER — Ambulatory Visit (INDEPENDENT_AMBULATORY_CARE_PROVIDER_SITE_OTHER): Payer: Medicaid Other | Admitting: Advanced Practice Midwife

## 2014-02-08 VITALS — BP 118/70 | Wt 187.0 lb

## 2014-02-08 DIAGNOSIS — Z331 Pregnant state, incidental: Secondary | ICD-10-CM

## 2014-02-08 DIAGNOSIS — Z1389 Encounter for screening for other disorder: Secondary | ICD-10-CM

## 2014-02-08 DIAGNOSIS — Z3480 Encounter for supervision of other normal pregnancy, unspecified trimester: Secondary | ICD-10-CM

## 2014-02-08 DIAGNOSIS — Z3482 Encounter for supervision of other normal pregnancy, second trimester: Secondary | ICD-10-CM

## 2014-02-08 MED ORDER — BUTALBITAL-APAP-CAFFEINE 50-325-40 MG PO TABS: 1.0000 | ORAL_TABLET | Freq: Four times a day (QID) | ORAL | Status: DC | PRN

## 2014-02-08 NOTE — Progress Notes (Signed)
[id number] [redacted]w[redacted]d Estimated Date of Delivery: 07/31/14  Blood pressure 118/70, weight 187 lb (84.823 kg), not currently breastfeeding.   BP and weight results reviewed and noted. (pt couldn't urinate, no sx, cx negative last month)  Please refer to the obstetrical flow sheet for the fundal height and fetal heart rate documentation:  Patient denies any bleeding and no rupture of membranes symptoms or regular contractions. Patient is having headaches, sometimes not relieved by tylenol All questions were answered.  Plan:  Continued routine obstetrical care, Fiorcet for HA prn  Follow up in 4 weeks for OB appointment, anatomy scan

## 2014-02-28 ENCOUNTER — Telehealth: Payer: Self-pay | Admitting: Advanced Practice Midwife

## 2014-02-28 NOTE — Telephone Encounter (Signed)
fioricet is only category B HA med.  If she wants a referral to HA specialist in our [place] or [place] office, let me know and I'll make the referral. Drenda Freeze

## 2014-03-01 NOTE — Telephone Encounter (Signed)
Pt states would like Rodena Piety to refer her to HA specialist.

## 2014-03-01 NOTE — Telephone Encounter (Signed)
Has appt with Jannifer Rodney at [facility] [place] [facility]/6 at 2 pm [address] Kathryne Sharper [phone]  If she would prefer to see Bonita Quin in her [place] office, she can call [phone] and schedule there.

## 2014-03-02 NOTE — Telephone Encounter (Signed)
Pt informed of referral appt time, date, address and number.

## 2014-03-08 ENCOUNTER — Encounter: Payer: Medicaid Other | Admitting: Advanced Practice Midwife

## 2014-03-08 ENCOUNTER — Ambulatory Visit (INDEPENDENT_AMBULATORY_CARE_PROVIDER_SITE_OTHER): Payer: Medicaid Other

## 2014-03-08 ENCOUNTER — Other Ambulatory Visit: Payer: Self-pay | Admitting: Advanced Practice Midwife

## 2014-03-08 ENCOUNTER — Ambulatory Visit (INDEPENDENT_AMBULATORY_CARE_PROVIDER_SITE_OTHER): Payer: Medicaid Other | Admitting: Women's Health

## 2014-03-08 VITALS — BP 110/76 | Wt 198.0 lb

## 2014-03-08 DIAGNOSIS — Z3482 Encounter for supervision of other normal pregnancy, second trimester: Secondary | ICD-10-CM

## 2014-03-08 DIAGNOSIS — Z1389 Encounter for screening for other disorder: Secondary | ICD-10-CM

## 2014-03-08 DIAGNOSIS — Z331 Pregnant state, incidental: Secondary | ICD-10-CM

## 2014-03-08 DIAGNOSIS — Z363 Encounter for antenatal screening for malformations: Secondary | ICD-10-CM

## 2014-03-08 DIAGNOSIS — Z3480 Encounter for supervision of other normal pregnancy, unspecified trimester: Secondary | ICD-10-CM

## 2014-03-08 NOTE — Progress Notes (Signed)
U/S(19+2wks)-active fetus, meas c/w dates, fluid wnl, anterior Gr 0 placenta, cx appears closed (3.6cm), FHR-156 bpm, bilateral adnexa appears WNL, no major abnl noted

## 2014-03-08 NOTE — Patient Instructions (Signed)
Second Trimester of Pregnancy The second trimester is from week 13 through week 28, months 4 through 6. The second trimester is often a time when you feel your best. Your body has also adjusted to being pregnant, and you begin to feel better physically. Usually, morning sickness has lessened or quit completely, you may have more energy, and you may have an increase in appetite. The second trimester is also a time when the fetus is growing rapidly. At the end of the sixth month, the fetus is about 9 inches long and weighs about 1 pounds. You will likely begin to feel the baby move (quickening) between 18 and 20 weeks of the pregnancy. BODY CHANGES Your body goes through many changes during pregnancy. The changes vary from woman to woman.   Your weight will continue to increase. You will notice your lower abdomen bulging out.  You may begin to get stretch marks on your hips, abdomen, and breasts.  You may develop headaches that can be relieved by medicines approved by your health care provider.  You may urinate more often because the fetus is pressing on your bladder.  You may develop or continue to have heartburn as a result of your pregnancy.  You may develop constipation because certain hormones are causing the muscles that push waste through your intestines to slow down.  You may develop hemorrhoids or swollen, bulging veins (varicose veins).  You may have back pain because of the weight gain and pregnancy hormones relaxing your joints between the bones in your pelvis and as a result of a shift in weight and the muscles that support your balance.  Your breasts will continue to grow and be tender.  Your gums may bleed and may be sensitive to brushing and flossing.  Dark spots or blotches (chloasma, mask of pregnancy) may develop on your face. This will likely fade after the baby is born.  A dark line from your belly button to the pubic area (linea nigra) may appear. This will likely fade  after the baby is born.  You may have changes in your hair. These can include thickening of your hair, rapid growth, and changes in texture. Some women also have hair loss during or after pregnancy, or hair that feels dry or thin. Your hair will most likely return to normal after your baby is born. WHAT TO EXPECT AT YOUR PRENATAL VISITS During a routine prenatal visit:  You will be weighed to make sure you and the fetus are growing normally.  Your blood pressure will be taken.  Your abdomen will be measured to track your baby's growth.  The fetal heartbeat will be listened to.  Any test results from the previous visit will be discussed. Your health care provider may ask you:  How you are feeling.  If you are feeling the baby move.  If you have had any abnormal symptoms, such as leaking fluid, bleeding, severe headaches, or abdominal cramping.  If you have any questions. Other tests that may be performed during your second trimester include:  Blood tests that check for:  Low iron levels (anemia).  Gestational diabetes (between 24 and 28 weeks).  Rh antibodies.  Urine tests to check for infections, diabetes, or protein in the urine.  An ultrasound to confirm the proper growth and development of the baby.  An amniocentesis to check for possible genetic problems.  Fetal screens for spina bifida and Down syndrome. HOME CARE INSTRUCTIONS   Avoid all smoking, herbs, alcohol, and unprescribed   drugs. These chemicals affect the formation and growth of the baby.  Follow your health care provider's instructions regarding medicine use. There are medicines that are either safe or unsafe to take during pregnancy.  Exercise only as directed by your health care provider. Experiencing uterine cramps is a good sign to stop exercising.  Continue to eat regular, healthy meals.  Wear a good support bra for breast tenderness.  Do not use hot tubs, steam rooms, or saunas.  Wear your  seat belt at all times when driving.  Avoid raw meat, uncooked cheese, cat litter boxes, and soil used by cats. These carry germs that can cause birth defects in the baby.  Take your prenatal vitamins.  Try taking a stool softener (if your health care provider approves) if you develop constipation. Eat more high-fiber foods, such as fresh vegetables or fruit and whole grains. Drink plenty of fluids to keep your urine clear or pale yellow.  Take warm sitz baths to soothe any pain or discomfort caused by hemorrhoids. Use hemorrhoid cream if your health care provider approves.  If you develop varicose veins, wear support hose. Elevate your feet for 15 minutes, 3-4 times a day. Limit salt in your diet.  Avoid heavy lifting, wear low heel shoes, and practice good posture.  Rest with your legs elevated if you have leg cramps or low back pain.  Visit your dentist if you have not gone yet during your pregnancy. Use a soft toothbrush to brush your teeth and be gentle when you floss.  A sexual relationship may be continued unless your health care provider directs you otherwise.  Continue to go to all your prenatal visits as directed by your health care provider. SEEK MEDICAL CARE IF:   You have dizziness.  You have mild pelvic cramps, pelvic pressure, or nagging pain in the abdominal area.  You have persistent nausea, vomiting, or diarrhea.  You have a bad smelling vaginal discharge.  You have pain with urination. SEEK IMMEDIATE MEDICAL CARE IF:   You have a fever.  You are leaking fluid from your vagina.  You have spotting or bleeding from your vagina.  You have severe abdominal cramping or pain.  You have rapid weight gain or loss.  You have shortness of breath with chest pain.  You notice sudden or extreme swelling of your face, hands, ankles, feet, or legs.  You have not felt your baby move in over an hour.  You have severe headaches that do not go away with  medicine.  You have vision changes. Document Released: 05/20/2001 Document Revised: 05/31/2013 Document Reviewed: 07/27/2012 Exit[employer] Patient Information 2015 [employer]. This information is not intended to replace advice given to you by your health care provider. Make sure you discuss any questions you have with your health care provider.  

## 2014-03-08 NOTE — Progress Notes (Signed)
Low-risk OB appointment [id number] 362w2d Estimated Date of Delivery: 07/31/14 BP 110/76  Wt 198 lb (89.812 kg)  BP, weight, and urine reviewed.  Refer to obstetrical flow sheet for FH & FHR.  Reports good fm.  Denies regular uc's, lof, vb, or uti s/s. No complaints. HAs are improving on their own, not taking fioricet. Wants to wait a little bit longer before cancelling appt w/ Bonita QuinLinda on 10/6 Reviewed ptl s/s, fm. Plan:  Continue routine obstetrical care  F/U in 4wks for OB appointment

## 2014-03-14 ENCOUNTER — Institutional Professional Consult (permissible substitution): Payer: Medicaid Other | Admitting: Nurse Practitioner

## 2014-04-05 ENCOUNTER — Ambulatory Visit (INDEPENDENT_AMBULATORY_CARE_PROVIDER_SITE_OTHER): Payer: Medicaid Other | Admitting: Advanced Practice Midwife

## 2014-04-05 ENCOUNTER — Encounter: Payer: Self-pay | Admitting: Advanced Practice Midwife

## 2014-04-05 VITALS — BP 110/60 | Wt 202.0 lb

## 2014-04-05 DIAGNOSIS — Z1389 Encounter for screening for other disorder: Secondary | ICD-10-CM

## 2014-04-05 DIAGNOSIS — Z3483 Encounter for supervision of other normal pregnancy, third trimester: Secondary | ICD-10-CM

## 2014-04-05 DIAGNOSIS — Z331 Pregnant state, incidental: Secondary | ICD-10-CM

## 2014-04-05 LAB — POCT URINALYSIS DIPSTICK
Blood, UA: NEGATIVE
GLUCOSE UA: NEGATIVE
Glucose, UA: NEGATIVE
Ketones, UA: NEGATIVE
LEUKOCYTES UA: NEGATIVE
Nitrite, UA: NEGATIVE
Protein, UA: NEGATIVE
Protein, UA: NEGATIVE

## 2014-04-05 NOTE — Patient Instructions (Signed)
1. Before your test, do not eat or drink anything for 8-10 hours prior to your  appointment (a small amount of water is allowed and you may take any medicines you normally take). Be sure to drink lots of water the day before. 2. When you arrive, your blood will be drawn for a 'fasting' blood sugar level.  Then you will be given a sweetened carbonated beverage to drink. You should  complete drinking this beverage within five minutes. After finishing the  beverage, you will have your blood drawn exactly 1 and 2 hours later. Having  your blood drawn on time is an important part of this test. A total of three blood  samples will be done. 3. The test takes approximately 2  hours. During the test, do not have anything to  eat or drink. Do not smoke, chew gum (not even sugarless gum) or use breath mints.  4. During the test you should remain close by and seated as much as possible and  avoid walking around. You may want to bring a book or something else to  occupy your time.  5. After your test, you may eat and drink as normal. You may want to bring a snack  to eat after the test is finished. Your provider will advise you as to the results of  this test and any follow-up if necessary  You will also be retested for syphilis, HIV and blood levels (anemia):  You were already tested in the first trimester, but [facility] recommends retesting.  Additionally, you will be tested for Type 2 Herpes. MOST people do not know that they have genital herpes, as only around 15% of people have outbreaks.  However, it is still transmittable to other people, including the baby (but only during the birth).  If you test positive for Type 2 Herpes, we place you on a medicine called acyclovir the last 6 weeks of your pregnancy to prevent transmission of the virus to the baby during the birth.    If your sugar test is positive for gestational diabetes, you will be given an phone call and further instructions discussed.   We typically do not call patients with positive herpes results, but will discuss it at your next appointment.  If you wish to know all of your test results before your next appointment, feel free to call the office, or look up your test results on Mychart.  (The range that the lab uses for normal values of the sugar test are not necessarily the range that is used for pregnant women; if your results are within the range, they are definitely normal.  However, if a value is deemed "high" by the lab, it may not be too high for a pregnant woman.  We will need to discuss the normal range if your value(s) fall in the "high" category).     Sometime between 27 and 36 weeks, it is recommended that you and anyone who is going to be in close contact with your baby receive the Tdap booster.  You should receive it EACH pregnancy, regardless of when your last booster was.  You may go to the Health Department (no appointment necessary) or your Primary Care office to receive the vaccine.  If you do not receive the vaccine prior to delivery, it will be offered in the hospital.  However, if you get it at least 2 weeks prior to delivery, you will have the added advantage of passing the immunity to your baby.   

## 2014-04-05 NOTE — Progress Notes (Signed)
[id number] 3472w2d Estimated Date of Delivery: 07/31/14  Blood pressure 110/60, weight 202 lb (91.627 kg), not currently breastfeeding.   BP weight and urine results all reviewed and noted.  Please refer to the obstetrical flow sheet for the fundal height and fetal heart rate documentation:  Patient reports good fetal movement, denies any bleeding and no rupture of membranes symptoms or regular contractions. Patient is without complaints.  HA's got much better after ~ 20 weeks All questions were answered.  Wants outpt salpingectomy; discussed Endo ablation, very interested.  Plan:  Continued routine obstetrical care,   Follow up in 4 weeks for OB appointment, PN2

## 2014-04-10 ENCOUNTER — Encounter: Payer: Self-pay | Admitting: Advanced Practice Midwife

## 2014-05-01 ENCOUNTER — Telehealth: Payer: Self-pay | Admitting: Women's Health

## 2014-05-01 MED ORDER — PANTOPRAZOLE SODIUM 20 MG PO TBEC: 20.0000 mg | DELAYED_RELEASE_TABLET | Freq: Two times a day (BID) | ORAL | Status: DC

## 2014-05-01 NOTE — Telephone Encounter (Signed)
Called pt, states she has tried every OTC heartburn relief including prevacid, prilosec, zantac, nexium, tums w/o relief. Talked to her pharmacist who recommended trying carafate. Pt has not tried protonix, will rx 20mg  bid, and if not improving can try carafate.  Cheral MarkerKimberly R. Dawnetta Copenhaver, CNM, [facility]NP-BC 05/01/2014 4:33 PM

## 2014-05-01 NOTE — Telephone Encounter (Signed)
Pt requesting Carafate for heart burn.

## 2014-05-03 ENCOUNTER — Encounter: Payer: Self-pay | Admitting: Women's Health

## 2014-05-03 ENCOUNTER — Other Ambulatory Visit: Payer: Medicaid Other

## 2014-05-03 ENCOUNTER — Ambulatory Visit (INDEPENDENT_AMBULATORY_CARE_PROVIDER_SITE_OTHER): Payer: Medicaid Other | Admitting: Women's Health

## 2014-05-03 VITALS — BP 114/72 | Wt 203.0 lb

## 2014-05-03 DIAGNOSIS — Z331 Pregnant state, incidental: Secondary | ICD-10-CM

## 2014-05-03 DIAGNOSIS — Z1389 Encounter for screening for other disorder: Secondary | ICD-10-CM

## 2014-05-03 DIAGNOSIS — Z0184 Encounter for antibody response examination: Secondary | ICD-10-CM

## 2014-05-03 DIAGNOSIS — Z131 Encounter for screening for diabetes mellitus: Secondary | ICD-10-CM

## 2014-05-03 DIAGNOSIS — Z114 Encounter for screening for human immunodeficiency virus [HIV]: Secondary | ICD-10-CM

## 2014-05-03 DIAGNOSIS — Z3483 Encounter for supervision of other normal pregnancy, third trimester: Secondary | ICD-10-CM

## 2014-05-03 DIAGNOSIS — Z113 Encounter for screening for infections with a predominantly sexual mode of transmission: Secondary | ICD-10-CM

## 2014-05-03 DIAGNOSIS — Z3402 Encounter for supervision of normal first pregnancy, second trimester: Secondary | ICD-10-CM

## 2014-05-03 LAB — POCT URINALYSIS DIPSTICK
Blood, UA: NEGATIVE
Glucose, UA: NEGATIVE
Ketones, UA: NEGATIVE
Leukocytes, UA: NEGATIVE
Nitrite, UA: NEGATIVE
Protein, UA: NEGATIVE

## 2014-05-03 LAB — CBC
HCT: 30.6 % — ABNORMAL LOW (ref 36.0–46.0)
Hemoglobin: 10.5 g/dL — ABNORMAL LOW (ref 12.0–15.0)
MCH: 31.5 pg (ref 26.0–34.0)
MCHC: 34.3 g/dL (ref 30.0–36.0)
MCV: 91.9 fL (ref 78.0–100.0)
MPV: 9.5 fL (ref 9.4–12.4)
Platelets: 223 10*3/uL (ref 150–400)
RBC: 3.33 MIL/uL — ABNORMAL LOW (ref 3.87–5.11)
RDW: 13.9 % (ref 11.5–15.5)
WBC: 9.4 10*3/uL (ref 4.0–10.5)

## 2014-05-03 NOTE — Progress Notes (Signed)
Low-risk OB appointment [id number] Estimated Date of Delivery: 07/31/14 BP 114/72 mmHg  Wt 203 lb (92.08 kg)  BP, weight, and urine reviewed.  Refer to obstetrical flow sheet for FH & FHR.  Reports good fm.  Denies regular uc's, lof, vb, or uti s/s. No complaints. Protonix 20mg  bid seems to be helping reflux.  Reviewed ptl s/s, fkc. Recommended Tdap at HD/PCP per CDC guidelines.  Plan:  Continue routine obstetrical care  F/U in 3wks for OB appointment  PN2 results

## 2014-05-03 NOTE — Patient Instructions (Signed)
Tdap Vaccine  It is recommended that you get the Tdap vaccine during the third trimester of EACH pregnancy to help protect your baby from getting pertussis (whooping cough)  27-36 weeks is the BEST time to do this so that you can pass the protection on to your baby. During pregnancy is better than after pregnancy, but if you are unable to get it during pregnancy it will be offered at the hospital.   You can get this vaccine at the health department or your family doctor  Everyone who will be around your baby should also be up-to-date on their vaccines. Adults (who are not pregnant) only need 1 dose of Tdap during adulthood.    Call the office ([phone]) or go to [facility] if:  You begin to have strong, frequent contractions  Your water breaks.  Sometimes it is a big gush of fluid, sometimes it is just a trickle that keeps getting your panties wet or running down your legs  You have vaginal bleeding.  It is normal to have a small amount of spotting if your cervix was checked.   You don't feel your baby moving like normal.  If you don't, get you something to eat and drink and lay down and focus on feeling your baby move.  You should feel at least 10 movements in 2 hours.  If you don't, you should call the office or go to [facility].    Third Trimester of Pregnancy The third trimester is from week 29 through week 42, months 7 through 9. The third trimester is a time when the fetus is growing rapidly. At the end of the ninth month, the fetus is about 20 inches in length and weighs 6-10 pounds.  BODY CHANGES Your body goes through many changes during pregnancy. The changes vary from woman to woman.   Your weight will continue to increase. You can expect to gain 25-35 pounds (11-16 kg) by the end of the pregnancy.  You may begin to get stretch marks on your hips, abdomen, and breasts.  You may urinate more often because the fetus is moving lower into your pelvis and pressing on  your bladder.  You may develop or continue to have heartburn as a result of your pregnancy.  You may develop constipation because certain hormones are causing the muscles that push waste through your intestines to slow down.  You may develop hemorrhoids or swollen, bulging veins (varicose veins).  You may have pelvic pain because of the weight gain and pregnancy hormones relaxing your joints between the bones in your pelvis. Backaches may result from overexertion of the muscles supporting your posture.  You may have changes in your hair. These can include thickening of your hair, rapid growth, and changes in texture. Some women also have hair loss during or after pregnancy, or hair that feels dry or thin. Your hair will most likely return to normal after your baby is born.  Your breasts will continue to grow and be tender. A yellow discharge may leak from your breasts called colostrum.  Your belly button may stick out.  You may feel short of breath because of your expanding uterus.  You may notice the fetus "dropping," or moving lower in your abdomen.  You may have a bloody mucus discharge. This usually occurs a few days to a week before labor begins.  Your cervix becomes thin and soft (effaced) near your due date. WHAT TO EXPECT AT YOUR PRENATAL EXAMS  You will have   prenatal exams every 2 weeks until week 36. Then, you will have weekly prenatal exams. During a routine prenatal visit:  You will be weighed to make sure you and the fetus are growing normally.  Your blood pressure is taken.  Your abdomen will be measured to track your baby's growth.  The fetal heartbeat will be listened to.  Any test results from the previous visit will be discussed.  You may have a cervical check near your due date to see if you have effaced. At around 36 weeks, your caregiver will check your cervix. At the same time, your caregiver will also perform a test on the secretions of the vaginal tissue.  This test is to determine if a type of bacteria, Group B streptococcus, is present. Your caregiver will explain this further. Your caregiver may ask you:  What your birth plan is.  How you are feeling.  If you are feeling the baby move.  If you have had any abnormal symptoms, such as leaking fluid, bleeding, severe headaches, or abdominal cramping.  If you have any questions. Other tests or screenings that may be performed during your third trimester include:  Blood tests that check for low iron levels (anemia).  Fetal testing to check the health, activity level, and growth of the fetus. Testing is done if you have certain medical conditions or if there are problems during the pregnancy. FALSE LABOR You may feel small, irregular contractions that eventually go away. These are called Braxton Hicks contractions, or false labor. Contractions may last for hours, days, or even weeks before true labor sets in. If contractions come at regular intervals, intensify, or become painful, it is best to be seen by your caregiver.  SIGNS OF LABOR   Menstrual-like cramps.  Contractions that are 5 minutes apart or less.  Contractions that start on the top of the uterus and spread down to the lower abdomen and back.  A sense of increased pelvic pressure or back pain.  A watery or bloody mucus discharge that comes from the vagina. If you have any of these signs before the 37th week of pregnancy, call your caregiver right away. You need to go to the hospital to get checked immediately. HOME CARE INSTRUCTIONS   Avoid all smoking, herbs, alcohol, and unprescribed drugs. These chemicals affect the formation and growth of the baby.  Follow your caregiver's instructions regarding medicine use. There are medicines that are either safe or unsafe to take during pregnancy.  Exercise only as directed by your caregiver. Experiencing uterine cramps is a good sign to stop exercising.  Continue to eat regular,  healthy meals.  Wear a good support bra for breast tenderness.  Do not use hot tubs, steam rooms, or saunas.  Wear your seat belt at all times when driving.  Avoid raw meat, uncooked cheese, cat litter boxes, and soil used by cats. These carry germs that can cause birth defects in the baby.  Take your prenatal vitamins.  Try taking a stool softener (if your caregiver approves) if you develop constipation. Eat more high-fiber foods, such as fresh vegetables or fruit and whole grains. Drink plenty of fluids to keep your urine clear or pale yellow.  Take warm sitz baths to soothe any pain or discomfort caused by hemorrhoids. Use hemorrhoid cream if your caregiver approves.  If you develop varicose veins, wear support hose. Elevate your feet for 15 minutes, 3-4 times a day. Limit salt in your diet.  Avoid heavy lifting, wear low   heal shoes, and practice good posture.  Rest a lot with your legs elevated if you have leg cramps or low back pain.  Visit your dentist if you have not gone during your pregnancy. Use a soft toothbrush to brush your teeth and be gentle when you floss.  A sexual relationship may be continued unless your caregiver directs you otherwise.  Do not travel far distances unless it is absolutely necessary and only with the approval of your caregiver.  Take prenatal classes to understand, practice, and ask questions about the labor and delivery.  Make a trial run to the hospital.  Pack your hospital bag.  Prepare the baby's nursery.  Continue to go to all your prenatal visits as directed by your caregiver. SEEK MEDICAL CARE IF:  You are unsure if you are in labor or if your water has broken.  You have dizziness.  You have mild pelvic cramps, pelvic pressure, or nagging pain in your abdominal area.  You have persistent nausea, vomiting, or diarrhea.  You have a bad smelling vaginal discharge.  You have pain with urination. SEEK IMMEDIATE MEDICAL CARE IF:    You have a fever.  You are leaking fluid from your vagina.  You have spotting or bleeding from your vagina.  You have severe abdominal cramping or pain.  You have rapid weight loss or gain.  You have shortness of breath with chest pain.  You notice sudden or extreme swelling of your face, hands, ankles, feet, or legs.  You have not felt your baby move in over an hour.  You have severe headaches that do not go away with medicine.  You have vision changes. Document Released: 05/20/2001 Document Revised: 05/31/2013 Document Reviewed: 07/27/2012 [employer] Patient Information 2015 [employer]. This information is not intended to replace advice given to you by your health care provider. Make sure you discuss any questions you have with your health care provider.  

## 2014-05-04 LAB — HIV ANTIBODY (ROUTINE TESTING W REFLEX): HIV 1&2 Ab, 4th Generation: NONREACTIVE

## 2014-05-04 LAB — GLUCOSE TOLERANCE, 2 HOURS W/ 1HR
Glucose, 1 hour: 161 mg/dL (ref 70–170)
Glucose, 2 hour: 125 mg/dL (ref 70–139)
Glucose, Fasting: 87 mg/dL (ref 70–99)

## 2014-05-04 LAB — RPR

## 2014-05-04 LAB — ANTIBODY SCREEN: ANTIBODY SCREEN: NEGATIVE

## 2014-05-05 LAB — HSV 2 ANTIBODY, IGG: HSV 2 Glycoprotein G Ab, IgG: <0.1 IV

## 2014-05-08 ENCOUNTER — Encounter: Payer: Self-pay | Admitting: Women's Health

## 2014-05-08 ENCOUNTER — Other Ambulatory Visit: Payer: Self-pay | Admitting: Women's Health

## 2014-05-08 DIAGNOSIS — O99013 Anemia complicating pregnancy, third trimester: Secondary | ICD-10-CM | POA: Insufficient documentation

## 2014-05-08 MED ORDER — FUSION PLUS PO CAPS: 1.0000 | ORAL_CAPSULE | ORAL | Status: DC

## 2014-05-24 ENCOUNTER — Encounter: Payer: Medicaid Other | Admitting: Advanced Practice Midwife

## 2014-05-26 ENCOUNTER — Encounter: Payer: Self-pay | Admitting: Obstetrics & Gynecology

## 2014-05-26 ENCOUNTER — Ambulatory Visit (INDEPENDENT_AMBULATORY_CARE_PROVIDER_SITE_OTHER): Payer: Medicaid Other | Admitting: Obstetrics & Gynecology

## 2014-05-26 VITALS — BP 116/70 | Wt 203.0 lb

## 2014-05-26 DIAGNOSIS — Z331 Pregnant state, incidental: Secondary | ICD-10-CM

## 2014-05-26 DIAGNOSIS — Z1389 Encounter for screening for other disorder: Secondary | ICD-10-CM

## 2014-05-26 DIAGNOSIS — Z3483 Encounter for supervision of other normal pregnancy, third trimester: Secondary | ICD-10-CM

## 2014-05-26 LAB — POCT URINALYSIS DIPSTICK
Glucose, UA: NEGATIVE
Ketones, UA: NEGATIVE
LEUKOCYTES UA: NEGATIVE
NITRITE UA: NEGATIVE
Protein, UA: NEGATIVE
RBC UA: NEGATIVE

## 2014-05-26 NOTE — Addendum Note (Signed)
Addended by: Criss AlvinePULLIAM, CHRYSTAL G on: 05/26/2014 09:53 AM   Modules accepted: Orders

## 2014-05-26 NOTE — Progress Notes (Signed)
[id number] Estimated Date of Delivery: 07/31/14  Blood pressure 116/70, weight 203 lb (92.08 kg), not currently breastfeeding.   BP weight and urine results all reviewed and noted.  Please refer to the obstetrical flow sheet for the fundal height and fetal heart rate documentation:  Patient reports good fetal movement, denies any bleeding and no rupture of membranes symptoms or regular contractions. Patient is without complaints. All questions were answered.  Plan:  Continued routine obstetrical care,   Follow up in 2 weeks for OB appointment, routine

## 2014-06-09 NOTE — L&D Delivery Note (Signed)
Delivery Note At 4:54 AM a viable female was delivered via Vaginal, Spontaneous Delivery (Presentation: Left Occiput Anterior).  APGAR: 8, 9; weight  .   Placenta status: Intact, Spontaneous.  Cord: 3 vessels with the following complications: None.  Anesthesia: None  Episiotomy: None Lacerations: None Est. Blood Loss (mL): 150  Mom to postpartum.  Baby to Couplet care / Skin to Skin.  STINSON, JACOB JEHIEL 07/26/2014, 5:26 AM

## 2014-06-12 ENCOUNTER — Encounter: Payer: Self-pay | Admitting: Obstetrics & Gynecology

## 2014-06-12 ENCOUNTER — Ambulatory Visit (INDEPENDENT_AMBULATORY_CARE_PROVIDER_SITE_OTHER): Payer: Medicaid Other | Admitting: Obstetrics & Gynecology

## 2014-06-12 VITALS — BP 102/70 | Wt 204.0 lb

## 2014-06-12 DIAGNOSIS — Z1389 Encounter for screening for other disorder: Secondary | ICD-10-CM

## 2014-06-12 DIAGNOSIS — Z331 Pregnant state, incidental: Secondary | ICD-10-CM

## 2014-06-12 DIAGNOSIS — Z3483 Encounter for supervision of other normal pregnancy, third trimester: Secondary | ICD-10-CM

## 2014-06-12 LAB — POCT URINALYSIS DIPSTICK
Blood, UA: NEGATIVE
Glucose, UA: NEGATIVE
Ketones, UA: NEGATIVE
LEUKOCYTES UA: NEGATIVE
Nitrite, UA: NEGATIVE
PROTEIN UA: NEGATIVE

## 2014-06-12 NOTE — Progress Notes (Signed)
.  [id number] [redacted]w[redacted]d Estimated Date of Delivery: 07/31/14  Blood pressure 102/70, weight 204 lb (92.534 kg), not currently breastfeeding.   BP weight and urine results all reviewed and noted.  Please refer to the obstetrical flow sheet for the fundal height and fetal heart rate documentation:  Patient reports good fetal movement, denies any bleeding and no rupture of membranes symptoms or regular contractions. Patient is without complaints. All questions were answered.  Plan:  Continued routine obstetrical care,   Follow up in 2 weeks for OB appointment, routine

## 2014-06-26 ENCOUNTER — Encounter: Payer: Self-pay | Admitting: Women's Health

## 2014-06-26 ENCOUNTER — Ambulatory Visit (INDEPENDENT_AMBULATORY_CARE_PROVIDER_SITE_OTHER): Payer: Medicaid Other | Admitting: Women's Health

## 2014-06-26 VITALS — BP 104/60 | Wt 206.0 lb

## 2014-06-26 DIAGNOSIS — Z3483 Encounter for supervision of other normal pregnancy, third trimester: Secondary | ICD-10-CM

## 2014-06-26 DIAGNOSIS — Z331 Pregnant state, incidental: Secondary | ICD-10-CM

## 2014-06-26 DIAGNOSIS — Z1389 Encounter for screening for other disorder: Secondary | ICD-10-CM

## 2014-06-26 LAB — POCT URINALYSIS DIPSTICK
Blood, UA: NEGATIVE
Glucose, UA: NEGATIVE
KETONES UA: NEGATIVE
Leukocytes, UA: NEGATIVE
Nitrite, UA: NEGATIVE
Protein, UA: NEGATIVE

## 2014-06-26 NOTE — Progress Notes (Signed)
Low-risk OB appointment [id number] Estimated Date of Delivery: 07/31/14 BP 104/60 mmHg  Wt 206 lb (93.441 kg)  BP, weight, and urine reviewed.  Refer to obstetrical flow sheet for FH & FHR.  Reports good fm.  Denies regular uc's, lof, vb, or uti s/s. No complaints. Reviewed ptl s/s, fkc. Plan:  Continue routine obstetrical care  F/U in 2wks for OB appointment and gbs

## 2014-06-26 NOTE — Patient Instructions (Signed)
Call the office ([phone]) or go to [facility] if:  You begin to have strong, frequent contractions  Your water breaks.  Sometimes it is a big gush of fluid, sometimes it is just a trickle that keeps getting your panties wet or running down your legs  You have vaginal bleeding.  It is normal to have a small amount of spotting if your cervix was checked.   You don't feel your baby moving like normal.  If you don't, get you something to eat and drink and lay down and focus on feeling your baby move.  You should feel at least 10 movements in 2 hours.  If you don't, you should call the office or go to [facility].    Preterm Labor Information Preterm labor is when labor starts at less than 37 weeks of pregnancy. The normal length of a pregnancy is 39 to 41 weeks. CAUSES Often, there is no identifiable underlying cause as to why a woman goes into preterm labor. One of the most common known causes of preterm labor is infection. Infections of the uterus, cervix, vagina, amniotic sac, bladder, kidney, or even the lungs (pneumonia) can cause labor to start. Other suspected causes of preterm labor include:   Urogenital infections, such as yeast infections and bacterial vaginosis.   Uterine abnormalities (uterine shape, uterine septum, fibroids, or bleeding from the placenta).   A cervix that has been operated on (it may fail to stay closed).   Malformations in the fetus.   Multiple gestations (twins, triplets, and so on).   Breakage of the amniotic sac.  RISK FACTORS  Having a previous history of preterm labor.   Having premature rupture of membranes (PROM).   Having a placenta that covers the opening of the cervix (placenta previa).   Having a placenta that separates from the uterus (placental abruption).   Having a cervix that is too weak to hold the fetus in the uterus (incompetent cervix).   Having too much fluid in the amniotic sac (polyhydramnios).   Taking  illegal drugs or smoking while pregnant.   Not gaining enough weight while pregnant.   Being younger than 18 and older than 30 years old.   Having a low socioeconomic status.   Being African American. SYMPTOMS Signs and symptoms of preterm labor include:   Menstrual-like cramps, abdominal pain, or back pain.  Uterine contractions that are regular, as frequent as six in an hour, regardless of their intensity (may be mild or painful).  Contractions that start on the top of the uterus and spread down to the lower abdomen and back.   A sense of increased pelvic pressure.   A watery or bloody mucus discharge that comes from the vagina.  TREATMENT Depending on the length of the pregnancy and other circumstances, your health care provider may suggest bed rest. If necessary, there are medicines that can be given to stop contractions and to mature the fetal lungs. If labor happens before 34 weeks of pregnancy, a prolonged hospital stay may be recommended. Treatment depends on the condition of both you and the fetus.  WHAT SHOULD YOU DO IF YOU THINK YOU ARE IN PRETERM LABOR? Call your health care provider right away. You will need to go to the hospital to get checked immediately. HOW CAN YOU PREVENT PRETERM LABOR IN FUTURE PREGNANCIES? You should:   Stop smoking if you smoke.  Maintain healthy weight gain and avoid chemicals and drugs that are not necessary.  Be watchful for   any type of infection.  Inform your health care provider if you have a known history of preterm labor. Document Released: 08/16/2003 Document Revised: 01/26/2013 Document Reviewed: 06/28/2012 ExitCare Patient Information 2015 [employer]. This information is not intended to replace advice given to you by your health care provider. Make sure you discuss any questions you have with your health care provider.  

## 2014-07-10 ENCOUNTER — Ambulatory Visit (INDEPENDENT_AMBULATORY_CARE_PROVIDER_SITE_OTHER): Payer: Medicaid Other | Admitting: Women's Health

## 2014-07-10 ENCOUNTER — Encounter: Payer: Self-pay | Admitting: Women's Health

## 2014-07-10 VITALS — BP 122/70 | Wt 205.0 lb

## 2014-07-10 DIAGNOSIS — Z1159 Encounter for screening for other viral diseases: Secondary | ICD-10-CM

## 2014-07-10 DIAGNOSIS — Z331 Pregnant state, incidental: Secondary | ICD-10-CM

## 2014-07-10 DIAGNOSIS — Z3483 Encounter for supervision of other normal pregnancy, third trimester: Secondary | ICD-10-CM

## 2014-07-10 DIAGNOSIS — Z118 Encounter for screening for other infectious and parasitic diseases: Secondary | ICD-10-CM

## 2014-07-10 DIAGNOSIS — Z3685 Encounter for antenatal screening for Streptococcus B: Secondary | ICD-10-CM

## 2014-07-10 DIAGNOSIS — Z1389 Encounter for screening for other disorder: Secondary | ICD-10-CM

## 2014-07-10 LAB — POCT URINALYSIS DIPSTICK
Blood, UA: NEGATIVE
Glucose, UA: NEGATIVE
Ketones, UA: NEGATIVE
NITRITE UA: NEGATIVE
Protein, UA: NEGATIVE

## 2014-07-10 LAB — OB RESULTS CONSOLE GBS: STREP GROUP B AG: NEGATIVE

## 2014-07-10 LAB — OB RESULTS CONSOLE GC/CHLAMYDIA
Chlamydia: NEGATIVE
GC PROBE AMP, GENITAL: NEGATIVE

## 2014-07-10 NOTE — Progress Notes (Signed)
Low-risk OB appointment [id number] 5160w0d Estimated Date of Delivery: 07/31/14 BP 122/70 mmHg  Wt 205 lb (92.987 kg)  BP, weight, and urine reviewed.  Refer to obstetrical flow sheet for FH & FHR.  Reports good fm.  Denies regular uc's, lof, vb, or uti s/s. Pelvis discomfort.  GBS collected SVE per request: 3/50/-2, vtx, posterior, soft Reviewed labor s/s, fkc. Plan:  Continue routine obstetrical care  F/U in 1wk for OB appointment

## 2014-07-10 NOTE — Patient Instructions (Signed)
Call the office ([phone]) or go to [facility] if:  You begin to have strong, frequent contractions  Your water breaks.  Sometimes it is a big gush of fluid, sometimes it is just a trickle that keeps getting your panties wet or running down your legs  You have vaginal bleeding.  It is normal to have a small amount of spotting if your cervix was checked.   You don't feel your baby moving like normal.  If you don't, get you something to eat and drink and lay down and focus on feeling your baby move.  You should feel at least 10 movements in 2 hours.  If you don't, you should call the office or go to [facility].    Braxton Hicks Contractions Contractions of the uterus can occur throughout pregnancy. Contractions are not always a sign that you are in labor.  WHAT ARE BRAXTON HICKS CONTRACTIONS?  Contractions that occur before labor are called Braxton Hicks contractions, or false labor. Toward the end of pregnancy (32-34 weeks), these contractions can develop more often and may become more forceful. This is not true labor because these contractions do not result in opening (dilatation) and thinning of the cervix. They are sometimes difficult to tell apart from true labor because these contractions can be forceful and people have different pain tolerances. You should not feel embarrassed if you go to the hospital with false labor. Sometimes, the only way to tell if you are in true labor is for your health care provider to look for changes in the cervix. If there are no prenatal problems or other health problems associated with the pregnancy, it is completely safe to be sent home with false labor and await the onset of true labor. HOW CAN YOU TELL THE DIFFERENCE BETWEEN TRUE AND FALSE LABOR? False Labor  The contractions of false labor are usually shorter and not as hard as those of true labor.   The contractions are usually irregular.   The contractions are often felt in the front of  the lower abdomen and in the groin.   The contractions may go away when you walk around or change positions while lying down.   The contractions get weaker and are shorter lasting as time goes on.   The contractions do not usually become progressively stronger, regular, and closer together as with true labor.  True Labor  Contractions in true labor last 30-70 seconds, become very regular, usually become more intense, and increase in frequency.   The contractions do not go away with walking.   The discomfort is usually felt in the top of the uterus and spreads to the lower abdomen and low back.   True labor can be determined by your health care provider with an exam. This will show that the cervix is dilating and getting thinner.  WHAT TO REMEMBER  Keep up with your usual exercises and follow other instructions given by your health care provider.   Take medicines as directed by your health care provider.   Keep your regular prenatal appointments.   Eat and drink lightly if you think you are going into labor.   If Braxton Hicks contractions are making you uncomfortable:   Change your position from lying down or resting to walking, or from walking to resting.   Sit and rest in a tub of warm water.   Drink 2-3 glasses of water. Dehydration may cause these contractions.   Do slow and deep breathing several times an hour.    WHEN SHOULD I SEEK IMMEDIATE MEDICAL CARE? Seek immediate medical care if:  Your contractions become stronger, more regular, and closer together.   You have fluid leaking or gushing from your vagina.   You have a fever.   You pass blood-tinged mucus.   You have vaginal bleeding.   You have continuous abdominal pain.   You have low back pain that you never had before.   You feel your baby's head pushing down and causing pelvic pressure.   Your baby is not moving as much as it used to.  Document Released: 05/26/2005 Document  Revised: 05/31/2013 Document Reviewed: 03/07/2013 ExitCare Patient Information 2015 [employer]. This information is not intended to replace advice given to you by your health care provider. Make sure you discuss any questions you have with your health care provider.  

## 2014-07-12 LAB — GC/CHLAMYDIA PROBE AMP
Chlamydia trachomatis, NAA: NEGATIVE
Neisseria gonorrhoeae by PCR: NEGATIVE

## 2014-07-14 LAB — CULTURE, BETA STREP (GROUP B ONLY): STREP GP B CULTURE: NEGATIVE

## 2014-07-17 ENCOUNTER — Ambulatory Visit (INDEPENDENT_AMBULATORY_CARE_PROVIDER_SITE_OTHER): Payer: Medicaid Other | Admitting: Women's Health

## 2014-07-17 VITALS — BP 138/72 | Wt 208.0 lb

## 2014-07-17 DIAGNOSIS — Z331 Pregnant state, incidental: Secondary | ICD-10-CM

## 2014-07-17 DIAGNOSIS — Z3483 Encounter for supervision of other normal pregnancy, third trimester: Secondary | ICD-10-CM

## 2014-07-17 DIAGNOSIS — Z1389 Encounter for screening for other disorder: Secondary | ICD-10-CM

## 2014-07-17 LAB — POCT URINALYSIS DIPSTICK
Blood, UA: NEGATIVE
Glucose, UA: NEGATIVE
Ketones, UA: NEGATIVE
LEUKOCYTES UA: NEGATIVE
Nitrite, UA: NEGATIVE
Protein, UA: NEGATIVE

## 2014-07-17 NOTE — Patient Instructions (Signed)
Call the office ([phone]) or go to [facility] if:  You begin to have strong, frequent contractions  Your water breaks.  Sometimes it is a big gush of fluid, sometimes it is just a trickle that keeps getting your panties wet or running down your legs  You have vaginal bleeding.  It is normal to have a small amount of spotting if your cervix was checked.   You don't feel your baby moving like normal.  If you don't, get you something to eat and drink and lay down and focus on feeling your baby move.  You should feel at least 10 movements in 2 hours.  If you don't, you should call the office or go to [facility].    Braxton Hicks Contractions Contractions of the uterus can occur throughout pregnancy. Contractions are not always a sign that you are in labor.  WHAT ARE BRAXTON HICKS CONTRACTIONS?  Contractions that occur before labor are called Braxton Hicks contractions, or false labor. Toward the end of pregnancy (32-34 weeks), these contractions can develop more often and may become more forceful. This is not true labor because these contractions do not result in opening (dilatation) and thinning of the cervix. They are sometimes difficult to tell apart from true labor because these contractions can be forceful and people have different pain tolerances. You should not feel embarrassed if you go to the hospital with false labor. Sometimes, the only way to tell if you are in true labor is for your health care provider to look for changes in the cervix. If there are no prenatal problems or other health problems associated with the pregnancy, it is completely safe to be sent home with false labor and await the onset of true labor. HOW CAN YOU TELL THE DIFFERENCE BETWEEN TRUE AND FALSE LABOR? False Labor  The contractions of false labor are usually shorter and not as hard as those of true labor.   The contractions are usually irregular.   The contractions are often felt in the front of  the lower abdomen and in the groin.   The contractions may go away when you walk around or change positions while lying down.   The contractions get weaker and are shorter lasting as time goes on.   The contractions do not usually become progressively stronger, regular, and closer together as with true labor.  True Labor  Contractions in true labor last 30-70 seconds, become very regular, usually become more intense, and increase in frequency.   The contractions do not go away with walking.   The discomfort is usually felt in the top of the uterus and spreads to the lower abdomen and low back.   True labor can be determined by your health care provider with an exam. This will show that the cervix is dilating and getting thinner.  WHAT TO REMEMBER  Keep up with your usual exercises and follow other instructions given by your health care provider.   Take medicines as directed by your health care provider.   Keep your regular prenatal appointments.   Eat and drink lightly if you think you are going into labor.   If Braxton Hicks contractions are making you uncomfortable:   Change your position from lying down or resting to walking, or from walking to resting.   Sit and rest in a tub of warm water.   Drink 2-3 glasses of water. Dehydration may cause these contractions.   Do slow and deep breathing several times an hour.    WHEN SHOULD I SEEK IMMEDIATE MEDICAL CARE? Seek immediate medical care if:  Your contractions become stronger, more regular, and closer together.   You have fluid leaking or gushing from your vagina.   You have a fever.   You pass blood-tinged mucus.   You have vaginal bleeding.   You have continuous abdominal pain.   You have low back pain that you never had before.   You feel your baby's head pushing down and causing pelvic pressure.   Your baby is not moving as much as it used to.  Document Released: 05/26/2005 Document  Revised: 05/31/2013 Document Reviewed: 03/07/2013 ExitCare Patient Information 2015 [employer]. This information is not intended to replace advice given to you by your health care provider. Make sure you discuss any questions you have with your health care provider.  

## 2014-07-17 NOTE — Progress Notes (Signed)
Low-risk OB appointment [id number] 9057w0d Estimated Date of Delivery: 07/31/14 BP 138/72 mmHg  Wt 208 lb (94.348 kg)  BP, weight, and urine reviewed.  Refer to obstetrical flow sheet for FH & FHR.  Reports good fm.  Denies regular uc's, lof, vb, or uti s/s. Same pelvic discomforts. Denies ha, scotomata, ruq/epigastric pain, n/v.   DTRs 2+, no clonus, trace edema SVE per request: 3/50/-2, vtx Reviewed labor s/s, fkc, pre-e s/s. Plan:  Continue routine obstetrical care  F/U in 1wk for OB appointment

## 2014-07-24 ENCOUNTER — Encounter: Payer: Medicaid Other | Admitting: Women's Health

## 2014-07-25 ENCOUNTER — Encounter: Payer: Self-pay | Admitting: Obstetrics & Gynecology

## 2014-07-25 ENCOUNTER — Ambulatory Visit (INDEPENDENT_AMBULATORY_CARE_PROVIDER_SITE_OTHER): Payer: Medicaid Other | Admitting: Obstetrics & Gynecology

## 2014-07-25 VITALS — BP 120/80 | Wt 209.0 lb

## 2014-07-25 DIAGNOSIS — Z331 Pregnant state, incidental: Secondary | ICD-10-CM

## 2014-07-25 DIAGNOSIS — Z1389 Encounter for screening for other disorder: Secondary | ICD-10-CM

## 2014-07-25 DIAGNOSIS — Z3483 Encounter for supervision of other normal pregnancy, third trimester: Secondary | ICD-10-CM | POA: Diagnosis not present

## 2014-07-25 LAB — POCT URINALYSIS DIPSTICK
Blood, UA: NEGATIVE
GLUCOSE UA: NEGATIVE
KETONES UA: NEGATIVE
LEUKOCYTES UA: NEGATIVE
NITRITE UA: NEGATIVE
PROTEIN UA: NEGATIVE

## 2014-07-25 NOTE — Progress Notes (Signed)
[id number] 70106w1d Estimated Date of Delivery: 07/31/14  Blood pressure 120/80, weight 209 lb (94.802 kg), not currently breastfeeding.   BP weight and urine results all reviewed and noted.  Please refer to the obstetrical flow sheet for the fundal height and fetal heart rate documentation:  Patient reports good fetal movement, denies any bleeding and no rupture of membranes symptoms or regular contractions. Patient is without complaints. All questions were answered.  Plan:  Continued routine obstetrical care,   Follow up in 1 weeks for OB appointment, routine

## 2014-07-26 ENCOUNTER — Inpatient Hospital Stay (HOSPITAL_COMMUNITY)
Admission: AD | Admit: 2014-07-26 | Discharge: 2014-07-27 | DRG: 775 | Disposition: A | Discharge status: Home / Self Care | Payer: Medicaid Other | Source: Ambulatory Visit | Attending: Family Medicine | Admitting: Family Medicine

## 2014-07-26 ENCOUNTER — Encounter: Payer: Medicaid Other | Admitting: Obstetrics and Gynecology

## 2014-07-26 ENCOUNTER — Encounter (HOSPITAL_COMMUNITY): Payer: Self-pay

## 2014-07-26 DIAGNOSIS — Z3490 Encounter for supervision of normal pregnancy, unspecified, unspecified trimester: Secondary | ICD-10-CM

## 2014-07-26 DIAGNOSIS — Z3A39 39 weeks gestation of pregnancy: Secondary | ICD-10-CM | POA: Diagnosis present

## 2014-07-26 DIAGNOSIS — Z3483 Encounter for supervision of other normal pregnancy, third trimester: Secondary | ICD-10-CM | POA: Diagnosis present

## 2014-07-26 LAB — TYPE AND SCREEN
ABO/RH(D): O POS
Antibody Screen: NEGATIVE

## 2014-07-26 MED ORDER — ONDANSETRON HCL 4 MG PO TABS: 4.0000 mg | ORAL_TABLET | ORAL | Status: DC | PRN

## 2014-07-26 MED ORDER — OXYTOCIN 40 UNITS IN LACTATED RINGERS INFUSION - SIMPLE MED: 62.5000 mL/h | INTRAVENOUS | Status: DC

## 2014-07-26 MED ORDER — ZOLPIDEM TARTRATE 5 MG PO TABS: 5.0000 mg | ORAL_TABLET | Freq: Every evening | ORAL | Status: DC | PRN

## 2014-07-26 MED ORDER — DIPHENHYDRAMINE HCL 25 MG PO CAPS: 25.0000 mg | ORAL_CAPSULE | Freq: Four times a day (QID) | ORAL | Status: DC | PRN

## 2014-07-26 MED ORDER — LIDOCAINE HCL (PF) 1 % IJ SOLN
30.0000 mL | INTRAMUSCULAR | Status: DC | PRN
  Filled 2014-07-26: qty 30

## 2014-07-26 MED ORDER — IBUPROFEN 600 MG PO TABS
600.0000 mg | ORAL_TABLET | Freq: Four times a day (QID) | ORAL | Status: DC
  Administered 2014-07-26 – 2014-07-27 (×6): 600 mg via ORAL
  Filled 2014-07-26 (×6): qty 1

## 2014-07-26 MED ORDER — OXYCODONE-ACETAMINOPHEN 5-325 MG PO TABS: 1.0000 | ORAL_TABLET | ORAL | Status: DC | PRN

## 2014-07-26 MED ORDER — OXYCODONE-ACETAMINOPHEN 5-325 MG PO TABS
1.0000 | ORAL_TABLET | ORAL | Status: DC | PRN
  Filled 2014-07-26 (×2): qty 1

## 2014-07-26 MED ORDER — OXYTOCIN 10 UNIT/ML IJ SOLN
10.0000 [IU] | Freq: Once | INTRAMUSCULAR | Status: AC
  Administered 2014-07-26: 10 [IU] via INTRAMUSCULAR

## 2014-07-26 MED ORDER — CITRIC ACID-SODIUM CITRATE 334-500 MG/5ML PO SOLN: 30.0000 mL | ORAL | Status: DC | PRN

## 2014-07-26 MED ORDER — FLEET ENEMA 7-19 GM/118ML RE ENEM: 1.0000 | ENEMA | RECTAL | Status: DC | PRN

## 2014-07-26 MED ORDER — TETANUS-DIPHTH-ACELL PERTUSSIS 5-2.5-18.5 LF-MCG/0.5 IM SUSP: 0.5000 mL | Freq: Once | INTRAMUSCULAR | Status: DC

## 2014-07-26 MED ORDER — LANOLIN HYDROUS EX OINT: TOPICAL_OINTMENT | CUTANEOUS | Status: DC | PRN

## 2014-07-26 MED ORDER — DIBUCAINE 1 % RE OINT: 1.0000 "application " | TOPICAL_OINTMENT | RECTAL | Status: DC | PRN

## 2014-07-26 MED ORDER — OXYCODONE-ACETAMINOPHEN 5-325 MG PO TABS: 2.0000 | ORAL_TABLET | ORAL | Status: DC | PRN

## 2014-07-26 MED ORDER — LACTATED RINGERS IV SOLN: 500.0000 mL | INTRAVENOUS | Status: DC | PRN

## 2014-07-26 MED ORDER — OXYCODONE-ACETAMINOPHEN 5-325 MG PO TABS
2.0000 | ORAL_TABLET | ORAL | Status: DC | PRN
  Administered 2014-07-26 – 2014-07-27 (×5): 2 via ORAL
  Filled 2014-07-26 (×5): qty 2

## 2014-07-26 MED ORDER — BENZOCAINE-MENTHOL 20-0.5 % EX AERO: 1.0000 "application " | INHALATION_SPRAY | CUTANEOUS | Status: DC | PRN

## 2014-07-26 MED ORDER — ONDANSETRON HCL 4 MG/2ML IJ SOLN: 4.0000 mg | Freq: Four times a day (QID) | INTRAMUSCULAR | Status: DC | PRN

## 2014-07-26 MED ORDER — PRENATAL MULTIVITAMIN CH
1.0000 | ORAL_TABLET | Freq: Every day | ORAL | Status: DC
  Administered 2014-07-26 – 2014-07-27 (×2): 1 via ORAL
  Filled 2014-07-26 (×2): qty 1

## 2014-07-26 MED ORDER — ACETAMINOPHEN 325 MG PO TABS: 650.0000 mg | ORAL_TABLET | ORAL | Status: DC | PRN

## 2014-07-26 MED ORDER — OXYTOCIN BOLUS FROM INFUSION: 500.0000 mL | INTRAVENOUS | Status: DC

## 2014-07-26 MED ORDER — SENNOSIDES-DOCUSATE SODIUM 8.6-50 MG PO TABS
2.0000 | ORAL_TABLET | ORAL | Status: DC
  Administered 2014-07-27: 2 via ORAL
  Filled 2014-07-26: qty 2

## 2014-07-26 MED ORDER — SIMETHICONE 80 MG PO CHEW: 80.0000 mg | CHEWABLE_TABLET | ORAL | Status: DC | PRN

## 2014-07-26 MED ORDER — LACTATED RINGERS IV SOLN: INTRAVENOUS | Status: DC

## 2014-07-26 MED ORDER — WITCH HAZEL-GLYCERIN EX PADS: 1.0000 "application " | MEDICATED_PAD | CUTANEOUS | Status: DC | PRN

## 2014-07-26 MED ORDER — ONDANSETRON HCL 4 MG/2ML IJ SOLN: 4.0000 mg | INTRAMUSCULAR | Status: DC | PRN

## 2014-07-26 NOTE — Progress Notes (Signed)
UR chart review completed.  

## 2014-07-26 NOTE — H&P (Signed)
LABOR ADMISSION HISTORY AND PHYSICAL  Rita Oconnor is a 29 y.o. female [phone] with IUP at [redacted]w[redacted]d here in active labor.   Prenatal History/Complications:  Past Medical History: Past Medical History  Diagnosis Date  . [id number](914.7)     Past Surgical History: No past surgical history on file.  Obstetrical History: OB History    Gravida Para Term Preterm AB TAB SAB Ectopic Multiple Living   Social History: History   Social History  . Marital Status: Married    Spouse Name: N/A  . Number of Children: N/A  . Years of Education: N/A   Social History Main Topics  . Smoking status: Never Smoker   . Smokeless tobacco: Not on file  . Alcohol Use: No  . Drug Use: No  . Sexual Activity: Not Currently    Birth Control/ Protection: None   Other Topics Concern  . Not on file   Social History Narrative    Family History: Family History  Problem Relation Age of Onset  . Cancer Paternal Aunt     breast  . Coronary artery disease Maternal Grandfather     Allergies: No Known Allergies  Prescriptions prior to admission  Medication Sig Dispense Refill Last Dose  . butalbital-acetaminophen-caffeine (FIORICET) 50-325-40 MG per tablet Take 1 tablet by mouth every 6 (six) hours as needed for headache. 30 tablet 1 Taking  . Iron-FA-B Cmp-C-Biot-Probiotic (FUSION PLUS) CAPS Take 1 capsule by mouth See admin instructions. 1 time daily between meals (Patient not taking: Reported on 06/12/2014) 30 capsule 6 Not Taking  . NON FORMULARY 2 oz daily. VEMMA   Not Taking  . norethindrone (MICRONOR,CAMILA,ERRIN) 0.35 MG tablet Take 1 tablet (0.35 mg total) by mouth daily. (Patient not taking: Reported on 05/03/2014) 1 Package 11 Not Taking  . pantoprazole (PROTONIX) 20 MG tablet Take 1 tablet (20 mg total) by mouth 2 (two) times daily before a meal. 60 tablet 6 Taking     Review of Systems   All systems reviewed and negative except as stated in HPI  not currently  breastfeeding. General appearance: alert and cooperative Lungs: clear to auscultation bilaterally Heart: regular rate and rhythm Abdomen: soft, non-tender; bowel sounds normal Extremities: Homans sign is negative, no sign of DVT Dilation: 8 Exam by:: Corky Downs   Prenatal labs: ABO, Rh: O/POS/-- (08/05 0934) Antibody: NEG (11/25 0928) Rubella:   RPR: NON REAC (11/25 0928)  HBsAg: NEGATIVE (08/05 0934)  HIV: NONREACTIVE (11/25 0928)  GBS: Negative (02/01 0000)  1 hr Glucola 87/161/165 Genetic screening  declined Anatomy US normal   Cl[facility]  FOB [place], [place], 4th baby, married  Dating By 9wk u/s  Pap Oct 2014 @ [facility]- neg per pt ([facility] doesn't have records, but pt sure it was there and was normal)  GC/CT Initial:   -/-             36+wks:  -/-  Genetic Screen NT/IT: declined  CF screen declined  Anatomic [place] Normal female 'Mardella Layman'  Flu vaccine declined  Tdap Recommended ~ 28wks  Glucose Screen  2 hr normal: 87/161/125  GBS neg  Feed Preference breast  Contraception salpingectomy  Circumcision Yes, if boy  Childbirth Classes declined  Pediatrician [facility]      Results for orders placed or performed in visit on 07/25/14 (from the past 24 hour(s))  POCT urinalysis dipstick   Collection Time: 07/25/14  10:48 AM  Result Value Ref Range   Color, UA     Clarity, UA     Glucose, UA neg    Bilirubin, UA     Ketones, UA neg    Spec Grav, UA     Blood, UA neg    pH, UA     Protein, UA neg    Urobilinogen, UA     Nitrite, UA neg    Leukocytes, UA Negative     Patient Active Problem List   Diagnosis Date Noted  . Pregnancy 07/26/2014  . Anemia during pregnancy in third trimester 05/08/2014  . Supervision of other normal pregnancy 08/31/2012    Assessment: Rita Oconnor is a 29 y.o. [phone] at 273w2d here in active labor  #Labor: expectant mgmt  Jorey Dollard ROCIO 07/26/2014, 5:08 AM

## 2014-07-26 NOTE — MAU Note (Signed)
Pt brought directly to room 7 per w/c, reports frequent contractions, denies bleeding or ROM. Pt unable to move from W/C due to frequency of UC's. As soon as pt able to stand and sit on bed, SVE and call to Dr. Arnette FeltsAcousta.

## 2014-07-26 NOTE — Lactation Note (Signed)
This note was copied from the chart of Rita Dorena Dewenny Herbst. Lactation Consultation Note  Patient Name: Rita Oconnor ZOXWR'UToday's Date: 07/26/2014 Reason for consult: Initial assessment  Visited with Mom and FOB, baby 8511 hrs old.  Mom states that baby has been latching and feeding well, except for the initial latch has been painful.  Offered assistance and review of basics.  Mom started feeding baby clothed and wrapped in blanket, in cradle hold.  Explained importance of skin to skin with breast feeding.  Baby positioned in cross cradle hold, and assisted Mom in latching deeply onto breast.  Mom stated it did feel more comfortable.  Reviewed manual breast expression and encouraged her to put colostrum on nipple.   Brochure left in room.  Informed Mom of IP and OP lactation services available to her.  To follow up in am, or prn as needed.   Consult Status Consult Status: Follow-up Date: 07/27/14 Follow-up type: In-patient    Rita Oconnor, Rita Oconnor 07/26/2014, 4:29 PM

## 2014-07-27 LAB — CBC
HCT: 27.2 % — ABNORMAL LOW (ref 36.0–46.0)
Hemoglobin: 8.8 g/dL — ABNORMAL LOW (ref 12.0–15.0)
MCH: 30.6 pg (ref 26.0–34.0)
MCHC: 32.4 g/dL (ref 30.0–36.0)
MCV: 94.4 fL (ref 78.0–100.0)
PLATELETS: 138 10*3/uL — AB (ref 150–400)
RBC: 2.88 MIL/uL — ABNORMAL LOW (ref 3.87–5.11)
RDW: 15.8 % — AB (ref 11.5–15.5)
WBC: 10.2 10*3/uL (ref 4.0–10.5)

## 2014-07-27 MED ORDER — IBUPROFEN 600 MG PO TABS: 600.0000 mg | ORAL_TABLET | Freq: Four times a day (QID) | ORAL | Status: DC

## 2014-07-27 NOTE — Discharge Instructions (Signed)

## 2014-07-27 NOTE — Lactation Note (Signed)
This note was copied from the chart of Rita Dorena Dewenny Attia. Lactation Consultation Note: Follow up visit with mom before DC. Experienced BF mom reports baby is latching well. LS 10 by RN but mom reports she is having trouble getting the baby to open wide and get deep onto the breast. Encouraged to wait for wide open mouth and if baby is not on deep enough to take her off and latch again. Comfort gels given with instructions for use. Mom put them on now and reports they feel great. No further questions at present. To call prn  Patient Name: Rita Oconnor RUEAV'WToday's Date: 07/27/2014 Reason for consult: Follow-up assessment   Maternal Data Formula Feeding for Exclusion: No Does the patient have breastfeeding experience prior to this delivery?: Yes  Feeding   LATCH Score/Interventions   Lactation Tools Discussed/Used     Consult Status Consult Status: Complete    Pamelia HoitWeeks, Emmogene Simson D 07/27/2014, 1:13 PM

## 2014-07-27 NOTE — Discharge Summary (Signed)
Obstetric Discharge Summary Reason for Admission: onset of labor Prenatal Procedures: none Intrapartum Procedures: spontaneous vaginal delivery Postpartum Procedures: none Complications-Operative and Postpartum: none  Delivery Note At 4:54 AM a viable female was delivered via Vaginal, Spontaneous Delivery (Presentation: Left Occiput Anterior).  APGAR: 8, 9; weight 7 lb 9.7 oz (3450 g).   Placenta status: Intact, Spontaneous.  Cord: 3 vessels with the following complications: None.  Cord pH: n/a  Anesthesia: None  Episiotomy: None Lacerations: None Suture Repair: n/a Est. Blood Loss (mL): 150  Mom to postpartum.  Baby to Couplet care / Skin to Skin.  Rita Oconnor, Rita Oconnor 07/27/2014, 7:37 AM     Hospital Course:  Active Problems:   Pregnancy   Rita Oconnor is a 29 y.o. [id number] s/p SVD.   She has postpartum course that was uncomplicated including no problems with ambulating, PO intake, urination, pain, or bleeding. The pt feels ready to go home and  will be discharged with outpatient follow-up.   Today: No acute events overnight.  Pt denies problems with ambulating, voiding or po intake.  She denies nausea or vomiting.  Pain is well controlled.  She has had flatus. She has not had bowel movement.  Lochia Minimal.  Plan for birth control is  salpingectomy.  Method of Feeding: Breast   H/H: Lab Results  Component Value Date/Time   HGB 8.8* 07/27/2014 05:43 AM   HGB 10.3 06/30/2012   HCT 27.2* 07/27/2014 05:43 AM   HCT 32 06/30/2012    Discharge Diagnoses: Term Pregnancy-delivered  Discharge Information: Date: 07/27/2014 Activity: pelvic rest Diet: routine  Medications: Ibuprofen Breast feeding:  Yes Condition: stable Instructions: refer to handout Discharge to: home  Follow-up Information    Follow up with [facility]. Schedule an appointment as soon as possible for a visit in 2 weeks.   Why:  For your postpartum appointment, and to talk to Dr Despina HiddenEure about  getting a salpingectomy.   Contact information:   [address] [place]nor [place] [id number] [phone]         Medication List    STOP taking these medications        butalbital-acetaminophen-caffeine 50-325-40 MG per tablet  Commonly known as:  FIORICET     pantoprazole 20 MG tablet  Commonly known as:  PROTONIX      TAKE these medications        ibuprofen 600 MG tablet  Commonly known as:  ADVIL,MOTRIN  Take 1 tablet (600 mg total) by mouth every 6 (six) hours.         Rita Oconnor, Rita Oconnor ,MD PGY-2  07/27/2014,7:37 AM  I have seen and examined this patient and I agree with the above. Cam HaiSHAW, KIMBERLY CNM 10:25 AM 07/27/2014

## 2014-07-28 LAB — RPR: RPR Ser Ql: NONREACTIVE

## 2014-08-01 ENCOUNTER — Encounter: Payer: Medicaid Other | Admitting: Obstetrics & Gynecology

## 2014-08-24 ENCOUNTER — Ambulatory Visit (INDEPENDENT_AMBULATORY_CARE_PROVIDER_SITE_OTHER): Payer: Medicaid Other | Admitting: Advanced Practice Midwife

## 2014-08-24 ENCOUNTER — Encounter: Payer: Self-pay | Admitting: Advanced Practice Midwife

## 2014-08-24 NOTE — Progress Notes (Signed)
  Rita Oconnor is a 29 y.o. who presents for a postpartum visit. She is 4 weeks postpartum following a spontaneous vaginal delivery. I have fully reviewed the prenatal and intrapartum course. The delivery was at 39.2  gestational weeks.  Anesthesia: none. Postpartum course has been uneventful. Baby's course has been uneventful. Baby is feeding by breast. Bleeding: no bleeding. Bowel function is normal. Bladder function is normal. Patient is not sexually active. Contraception method is none. Postpartum depression screening: negative.   Current outpatient prescriptions:  .  ibuprofen (ADVIL,MOTRIN) 600 MG tablet, Take 1 tablet (600 mg total) by mouth every 6 (six) hours. (Patient not taking: Reported on 08/24/2014), Disp: 30 tablet, Rfl: 0  Review of Systems   Constitutional: Negative for fever and chills Eyes: Negative for visual disturbances Respiratory: Negative for shortness of breath, dyspnea Cardiovascular: Negative for chest pain or palpitations  Gastrointestinal: Negative for vomiting, diarrhea and constipation Genitourinary: Negative for dysuria and urgency Musculoskeletal: Negative for back pain, joint pain, myalgias  Neurological: Negative for dizziness and headaches   Objective:     Filed Vitals:   08/24/14 1041  BP: 120/80  Pulse: 60   General:  alert, cooperative and no distress   Breasts:  negative  Lungs: clear to auscultation bilaterally  Heart:  regular rate and rhythm  Abdomen: Soft, nontender   Vulva:  normal  Vagina: normal vagina  Cervix:  closed  Corpus: Well involuted     Rectal Exam: no hemorrhoids        Assessment:    normal postpartum exam.  Plan:    1. Contraception: salpingectomy with endo ablation 2. Follow up in: first week in april preop salpingectomy/endo ablation or as needed.

## 2014-09-11 ENCOUNTER — Ambulatory Visit: Payer: Medicaid Other | Admitting: Women's Health

## 2014-09-14 ENCOUNTER — Ambulatory Visit (INDEPENDENT_AMBULATORY_CARE_PROVIDER_SITE_OTHER): Payer: Medicaid Other | Admitting: Obstetrics & Gynecology

## 2014-09-14 ENCOUNTER — Encounter: Payer: Self-pay | Admitting: Obstetrics & Gynecology

## 2014-09-14 VITALS — BP 120/80 | HR 68 | Wt 180.0 lb

## 2014-09-14 DIAGNOSIS — Z3009 Encounter for other general counseling and advice on contraception: Secondary | ICD-10-CM | POA: Diagnosis not present

## 2014-09-14 DIAGNOSIS — N946 Dysmenorrhea, unspecified: Secondary | ICD-10-CM

## 2014-09-14 DIAGNOSIS — N920 Excessive and frequent menstruation with regular cycle: Secondary | ICD-10-CM

## 2014-09-14 MED ORDER — ONDANSETRON HCL 8 MG PO TABS: 8.0000 mg | ORAL_TABLET | Freq: Three times a day (TID) | ORAL | Status: DC | PRN

## 2014-09-14 MED ORDER — HYDROCODONE-ACETAMINOPHEN 5-325 MG PO TABS: 1.0000 | ORAL_TABLET | Freq: Four times a day (QID) | ORAL | Status: DC | PRN

## 2014-09-14 MED ORDER — KETOROLAC TROMETHAMINE 10 MG PO TABS: 10.0000 mg | ORAL_TABLET | Freq: Three times a day (TID) | ORAL | Status: DC | PRN

## 2014-09-21 NOTE — Patient Instructions (Signed)
Rita Oconnor  09/21/2014   Your procedure is scheduled on:  09/27/2014  Report to [facility] at 11:00 AM.  Call this number if you have problems the morning of surgery: [phone]   Remember:   Do not eat food or drink liquids after midnight.   Take these medicines the morning of surgery with A SIP OF WATER: Zofran, Hydrocodone   Do not wear jewelry, make-up or nail polish.  Do not wear lotions, powders, or perfumes. You may wear deodorant.  Do not shave 48 hours prior to surgery. Men may shave face and neck.  Do not bring valuables to the hospital.  [facility] is not responsible for any belongings or valuables.               Contacts, dentures or bridgework may not be worn into surgery.  Leave suitcase in the car. After surgery it may be brought to your room.  For patients admitted to the hospital, discharge time is determined by your treatment team.               Patients discharged the day of surgery will not be allowed to drive home.  Name and phone number of your driver:   Special Instructions: Shower using CHG 2 nights before surgery and the night before surgery.  If you shower the day of surgery use CHG.  Use special wash - you have one bottle of CHG for all showers.  You should use approximately 1/3 of the bottle for each shower.   Please read over the following fact sheets that you were given: Surgical Site Infection Prevention and Anesthesia Post-op Instructions   PATIENT INSTRUCTIONS POST-ANESTHESIA  IMMEDIATELY FOLLOWING SURGERY:  Do not drive or operate machinery for the first twenty four hours after surgery.  Do not make any important decisions for twenty four hours after surgery or while taking narcotic pain medications or sedatives.  If you develop intractable nausea and vomiting or a severe headache please notify your doctor immediately.  FOLLOW-UP:  Please make an appointment with your surgeon as instructed. You do not need to follow up with anesthesia unless  specifically instructed to do so.  WOUND CARE INSTRUCTIONS (if applicable):  Keep a dry clean dressing on the anesthesia/puncture wound site if there is drainage.  Once the wound has quit draining you may leave it open to air.  Generally you should leave the bandage intact for twenty four hours unless there is drainage.  If the epidural site drains for more than 36-48 hours please call the anesthesia department.  QUESTIONS?:  Please feel free to call your physician or the hospital operator if you have any questions, and they will be happy to assist you.      Salpingectomy Salpingectomy, also called tubectomy, is the surgical removal of one of the fallopian tubes. The fallopian tubes are tubes that are connected to the uterus. These tubes transport the egg from the ovary to the uterus. A salpingectomy may be done for various reasons, including:   A tubal (ectopic) pregnancy. This is especially true if the tube ruptures.  An infected fallopian tube.  The need to remove the fallopian tube when removing an ovary with a cyst or tumor.  The need to remove the fallopian tube when removing the uterus.  Cancer of the fallopian tube or nearby organs. Removing one fallopian tube does not prevent you from becoming pregnant. It also does not cause problems with your menstrual periods.  LET [facility]  CARE PROVIDER KNOW ABOUT:  Any allergies you have.  All medicines you are taking, including vitamins, herbs, eye drops, creams, and over-the-counter medicines.  Previous problems you or members of your family have had with the use of anesthetics.  Any blood disorders you have.  Previous surgeries you have had.  Medical conditions you have. RISKS AND COMPLICATIONS  Generally, this is a safe procedure. However, as with any procedure, complications can occur. Possible complications include:  Injury to surrounding organs.  Bleeding.  Infection.  Problems related to anesthesia. BEFORE THE  PROCEDURE  Ask your health care provider about changing or stopping your regular medicines. You may need to stop taking certain medicines, such as aspirin or blood thinners, at least 1 week before the surgery.  Do not eat or drink anything for at least 8 hours before the surgery.  If you smoke, do not smoke for at least 2 weeks before the surgery.  Make plans to have someone drive you home after the procedure or after your hospital stay. Also arrange for someone to help you with activities during recovery. PROCEDURE   You will be given medicine to help you relax before the procedure (sedative). You will then be given medicine to make you sleep through the procedure (general anesthetic). These medicines will be given through an IV access tube that is put into one of your veins.  Once you are asleep, your lower abdomen will be shaved and cleaned. A thin, flexible tube (catheter) will be placed in your bladder.  The surgeon may use a laparoscopic, robotic, or open technique for this surgery:  In the laparoscopic technique, the surgery is done through two small cuts (incisions) in the abdomen. A thin, lighted tube with a tiny camera on the end (laparoscope) is inserted into one of the incisions. The tools needed for the procedure are put through the other incision.  A robotic technique may be chosen to perform complex surgery in a small space. In the robotic technique, small incisions will be made. A camera and surgical instruments are passed through the incisions. Surgical instruments will be controlled with the help of a robotic arm.  In the open technique, the surgery is done through one large incision in the abdomen.  Using any of these techniques, the surgeon removes the fallopian tube from where it attaches to the uterus. The blood vessels will be clamped and tied.  The surgeon then uses staples or stitches to close the incision or incisions. AFTER THE PROCEDURE   You will be taken to a  recovery area where your progress will be monitored for 1-3 hours.  If the laparoscopic technique was used, you may be allowed to go home after several hours. You may have some shoulder pain after the laparoscopic procedure. This is normal and usually goes away in a day or two.  If the open technique was used, you will be admitted to the hospital for a couple of days.  You will be given pain medicine if needed.  The IV access tube and catheter will be removed before you are discharged. Document Released: 10/12/2008 Document Revised: 03/16/2013 Document Reviewed: 11/17/2012 [facility] Patient Information 2015 [place], [place]. This information is not intended to replace advice given to you by your health care provider. Make sure you discuss any questions you have with your health care provider. Hysteroscopy Hysteroscopy is a procedure used for looking inside the womb (uterus). It may be done for various reasons, including:  To evaluate abnormal bleeding, fibroid (  benign, noncancerous) tumors, polyps, scar tissue (adhesions), and possibly cancer of the uterus.  To look for lumps (tumors) and other uterine growths.  To look for causes of why a woman cannot get pregnant (infertility), causes of recurrent loss of pregnancy (miscarriages), or a lost intrauterine device (IUD).  To perform a sterilization by blocking the fallopian tubes from inside the uterus. In this procedure, a thin, flexible tube with a tiny light and camera on the end of it (hysteroscope) is used to look inside the uterus. A hysteroscopy should be done right after a menstrual period to be sure you are not pregnant. LET [facility] CARE PROVIDER KNOW ABOUT:   Any allergies you have.  All medicines you are taking, including vitamins, herbs, eye drops, creams, and over-the-counter medicines.  Previous problems you or members of your family have had with the use of anesthetics.  Any blood disorders you have.  Previous surgeries  you have had.  Medical conditions you have. RISKS AND COMPLICATIONS  Generally, this is a safe procedure. However, as with any procedure, complications can occur. Possible complications include:  Putting a hole in the uterus.  Excessive bleeding.  Infection.  Damage to the cervix.  Injury to other organs.  Allergic reaction to medicines.  Too much fluid used in the uterus for the procedure. BEFORE THE PROCEDURE   Ask your health care provider about changing or stopping any regular medicines.  Do not take aspirin or blood thinners for 1 week before the procedure, or as directed by your health care provider. These can cause bleeding.  If you smoke, do not smoke for 2 weeks before the procedure.  In some cases, a medicine is placed in the cervix the day before the procedure. This medicine makes the cervix have a larger opening (dilate). This makes it easier for the instrument to be inserted into the uterus during the procedure.  Do not eat or drink anything for at least 8 hours before the surgery.  Arrange for someone to take you home after the procedure. PROCEDURE   You may be given a medicine to relax you (sedative). You may also be given one of the following:  A medicine that numbs the area around the cervix (local anesthetic).  A medicine that makes you sleep through the procedure (general anesthetic).  The hysteroscope is inserted through the vagina into the uterus. The camera on the hysteroscope sends a picture to a TV screen. This gives the surgeon a good view inside the uterus.  During the procedure, air or a liquid is put into the uterus, which allows the surgeon to see better.  Sometimes, tissue is gently scraped from inside the uterus. These tissue samples are sent to a lab for testing. AFTER THE PROCEDURE   If you had a general anesthetic, you may be groggy for a couple hours after the procedure.  If you had a local anesthetic, you will be able to go home as  soon as you are stable and feel ready.  You may have some cramping. This normally lasts for a couple days.  You may have bleeding, which varies from light spotting for a few days to menstrual-like bleeding for 3-7 days. This is normal.  If your test results are not back during the visit, make an appointment with your health care provider to find out the results. Document Released: 09/01/2000 Document Revised: 03/16/2013 Document Reviewed: 12/23/2012 [facility] Patient Information 2015 [place], [place]. This information is not intended to replace advice given  to you by your health care provider. Make sure you discuss any questions you have with your health care provider. Endometrial Ablation Endometrial ablation removes the lining of the uterus (endometrium). It is usually a same-day, outpatient treatment. Ablation helps avoid major surgery, such as surgery to remove the cervix and uterus (hysterectomy). After endometrial ablation, you will have little or no menstrual bleeding and may not be able to have children. However, if you are premenopausal, you will need to use a reliable method of birth control following the procedure because of the small chance that pregnancy can occur. There are different reasons to have this procedure, which include:  Heavy periods.  Bleeding that is causing anemia.  Irregular bleeding.  Bleeding fibroids on the lining inside the uterus if they are smaller than 3 centimeters. This procedure should not be done if:  You want children in the future.  You have severe cramps with your menstrual period.  You have precancerous or cancerous cells in your uterus.  You were recently pregnant.  You have gone through menopause.  You have had major surgery on the uterus, such as a cesarean delivery. LET [facility] CARE PROVIDER KNOW ABOUT:  Any allergies you have.  All medicines you are taking, including vitamins, herbs, eye drops, creams, and over-the-counter  medicines.  Previous problems you or members of your family have had with the use of anesthetics.  Any blood disorders you have.  Previous surgeries you have had.  Medical conditions you have. RISKS AND COMPLICATIONS  Generally, this is a safe procedure. However, as with any procedure, complications can occur. Possible complications include:  Perforation of the uterus.  Bleeding.  Infection of the uterus, bladder, or vagina.  Injury to surrounding organs.  An air bubble to the lung (air embolus).  Pregnancy following the procedure.  Failure of the procedure to help the problem, requiring hysterectomy.  Decreased ability to diagnose cancer in the lining of the uterus. BEFORE THE PROCEDURE  The lining of the uterus must be tested to make sure there is no pre-cancerous or cancer cells present.  An ultrasound may be performed to look at the size of the uterus and to check for abnormalities.  Medicines may be given to thin the lining of the uterus. PROCEDURE  During the procedure, your health care provider will use a tool called a resectoscope to help see inside your uterus. There are different ways to remove the lining of your uterus.   Radiofrequency - This method uses a radiofrequency-alternating electric current to remove the lining of the uterus.  Cryotherapy - This method uses extreme cold to freeze the lining of the uterus.  Heated-Free Liquid - This method uses heated salt (saline) solution to remove the lining of the uterus.  Microwave - This method uses high-energy microwaves to heat up the lining of the uterus to remove it.  Thermal balloon - This method involves inserting a catheter with a balloon tip into the uterus. The balloon tip is filled with heated fluid to remove the lining of the uterus. AFTER THE PROCEDURE  After your procedure, do not have sexual intercourse or insert anything into your vagina until permitted by your health care provider. After the  procedure, you may experience:  Cramps.  Vaginal discharge.  Frequent urination. Document Released: 04/04/2004 Document Revised: 01/26/2013 Document Reviewed: 10/27/2012 [facility] Patient Information 2015 [place], [place]. This information is not intended to replace advice given to you by your health care provider. Make sure you discuss any  questions you have with your health care provider. Dilation and Curettage or Vacuum Curettage Dilation and curettage (D&C) and vacuum curettage are minor procedures. A D&C involves stretching (dilation) the cervix and scraping (curettage) the inside lining of the womb (uterus). During a D&C, tissue is gently scraped from the inside lining of the uterus. During a vacuum curettage, the lining and tissue in the uterus are removed with the use of gentle suction.  Curettage may be performed to either diagnose or treat a problem. As a diagnostic procedure, curettage is performed to examine tissues from the uterus. A diagnostic curettage may be performed for the following symptoms:   Irregular bleeding in the uterus.   Bleeding with the development of clots.   Spotting between menstrual periods.   Prolonged menstrual periods.   Bleeding after menopause.   No menstrual period (amenorrhea).   A change in size and shape of the uterus.  As a treatment procedure, curettage may be performed for the following reasons:   Removal of an IUD (intrauterine device).   Removal of retained placenta after giving birth. Retained placenta can cause an infection or bleeding severe enough to require transfusions.   Abortion.   Miscarriage.   Removal of polyps inside the uterus.   Removal of uncommon types of noncancerous lumps (fibroids).  LET [facility] CARE PROVIDER KNOW ABOUT:   Any allergies you have.   All medicines you are taking, including vitamins, herbs, eye drops, creams, and over-the-counter medicines.   Previous problems you or members  of your family have had with the use of anesthetics.   Any blood disorders you have.   Previous surgeries you have had.   Medical conditions you have. RISKS AND COMPLICATIONS  Generally, this is a safe procedure. However, as with any procedure, complications can occur. Possible complications include:  Excessive bleeding.   Infection of the uterus.   Damage to the cervix.   Development of scar tissue (adhesions) inside the uterus, later causing abnormal amounts of menstrual bleeding.   Complications from the general anesthetic, if a general anesthetic is used.   Putting a hole (perforation) in the uterus. This is rare.  BEFORE THE PROCEDURE   Eat and drink before the procedure only as directed by your health care provider.   Arrange for someone to take you home.  PROCEDURE  This procedure usually takes about 15-30 minutes.  You will be given one of the following:  A medicine that numbs the area in and around the cervix (local anesthetic).   A medicine to make you sleep through the procedure (general anesthetic).  You will lie on your back with your legs in stirrups.   A warm metal or plastic instrument (speculum) will be placed in your vagina to keep it open and to allow the health care provider to see the cervix.  There are two ways in which your cervix can be softened and dilated. These include:   Taking a medicine.   Having thin rods (laminaria) inserted into your cervix.   A curved tool (curette) will be used to scrape cells from the inside lining of the uterus. In some cases, gentle suction is applied with the curette. The curette will then be removed.  AFTER THE PROCEDURE   You will rest in the recovery area until you are stable and are ready to go home.   You may feel sick to your stomach (nauseous) or throw up (vomit) if you were given a general anesthetic.   You  may have a sore throat if a tube was placed in your throat during general  anesthesia.   You may have light cramping and bleeding. This may last for 2 days to 2 weeks after the procedure.   Your uterus needs to make a new lining after the procedure. This may make your next period late. Document Released: 05/26/2005 Document Revised: 01/26/2013 Document Reviewed: 12/23/2012 [facility] Patient Information 2015 [place]. This information is not intended to replace advice given to you by your health care provider. Make sure you discuss any questions you have with your health care provider.

## 2014-09-22 ENCOUNTER — Encounter (HOSPITAL_COMMUNITY)
Admission: RE | Admit: 2014-09-22 | Discharge: 2014-09-22 | Disposition: A | Discharge status: Home / Self Care | Payer: Medicaid Other | Source: Ambulatory Visit | Attending: Obstetrics & Gynecology | Admitting: Obstetrics & Gynecology

## 2014-09-22 ENCOUNTER — Encounter (HOSPITAL_COMMUNITY): Payer: Self-pay

## 2014-09-22 DIAGNOSIS — N920 Excessive and frequent menstruation with regular cycle: Secondary | ICD-10-CM | POA: Insufficient documentation

## 2014-09-22 DIAGNOSIS — Z01812 Encounter for preprocedural laboratory examination: Secondary | ICD-10-CM | POA: Insufficient documentation

## 2014-09-22 LAB — HCG, QUANTITATIVE, PREGNANCY

## 2014-09-22 LAB — COMPREHENSIVE METABOLIC PANEL
ALBUMIN: 4.4 g/dL (ref 3.5–5.2)
ALT: 18 U/L (ref 0–35)
AST: 17 U/L (ref 0–37)
Alkaline Phosphatase: 48 U/L (ref 39–117)
Anion gap: 8 (ref 5–15)
BUN: 19 mg/dL (ref 6–23)
CHLORIDE: 106 mmol/L (ref 96–112)
CO2: 26 mmol/L (ref 19–32)
Calcium: 9.7 mg/dL (ref 8.4–10.5)
Creatinine, Ser: 0.61 mg/dL (ref 0.50–1.10)
GFR calc non Af Amer: >90 mL/min (ref >90)
Glucose, Bld: 93 mg/dL (ref 70–99)
POTASSIUM: 4.5 mmol/L (ref 3.5–5.1)
SODIUM: 140 mmol/L (ref 135–145)
TOTAL PROTEIN: 7.6 g/dL (ref 6.0–8.3)
Total Bilirubin: 0.5 mg/dL (ref 0.3–1.2)

## 2014-09-22 LAB — URINE MICROSCOPIC-ADD ON

## 2014-09-22 LAB — CBC
HEMATOCRIT: 38.9 % (ref 36.0–46.0)
HEMOGLOBIN: 12.1 g/dL (ref 12.0–15.0)
MCH: 28.9 pg (ref 26.0–34.0)
MCHC: 31.1 g/dL (ref 30.0–36.0)
MCV: 93.1 fL (ref 78.0–100.0)
Platelets: 263 10*3/uL (ref 150–400)
RBC: 4.18 MIL/uL (ref 3.87–5.11)
RDW: 15.1 % (ref 11.5–15.5)
WBC: 7.9 10*3/uL (ref 4.0–10.5)

## 2014-09-22 LAB — URINALYSIS, ROUTINE W REFLEX MICROSCOPIC
Bilirubin Urine: NEGATIVE
Glucose, UA: NEGATIVE mg/dL
HGB URINE DIPSTICK: NEGATIVE
Ketones, ur: NEGATIVE mg/dL
NITRITE: NEGATIVE
PROTEIN: NEGATIVE mg/dL
Specific Gravity, Urine: >1.03 — ABNORMAL HIGH (ref 1.005–1.030)
UROBILINOGEN UA: 0.2 mg/dL (ref 0.0–1.0)
pH: 5 (ref 5.0–8.0)

## 2014-09-27 ENCOUNTER — Ambulatory Visit (HOSPITAL_COMMUNITY): Payer: Medicaid Other | Admitting: Certified Registered Nurse Anesthetist

## 2014-09-27 ENCOUNTER — Encounter (HOSPITAL_COMMUNITY): Payer: Self-pay | Admitting: Certified Registered Nurse Anesthetist

## 2014-09-27 ENCOUNTER — Ambulatory Visit (HOSPITAL_COMMUNITY)
Admission: RE | Admit: 2014-09-27 | Discharge: 2014-09-27 | Disposition: A | Discharge status: Home / Self Care | Payer: Medicaid Other | Source: Ambulatory Visit | Attending: Obstetrics & Gynecology | Admitting: Obstetrics & Gynecology

## 2014-09-27 ENCOUNTER — Encounter (HOSPITAL_COMMUNITY)
Admission: RE | Disposition: A | Discharge status: Home / Self Care | Payer: Self-pay | Source: Ambulatory Visit | Attending: Obstetrics & Gynecology

## 2014-09-27 DIAGNOSIS — N946 Dysmenorrhea, unspecified: Secondary | ICD-10-CM | POA: Insufficient documentation

## 2014-09-27 DIAGNOSIS — Z539 Procedure and treatment not carried out, unspecified reason: Secondary | ICD-10-CM | POA: Diagnosis not present

## 2014-09-27 DIAGNOSIS — Z302 Encounter for sterilization: Secondary | ICD-10-CM | POA: Diagnosis not present

## 2014-09-27 DIAGNOSIS — N920 Excessive and frequent menstruation with regular cycle: Secondary | ICD-10-CM | POA: Insufficient documentation

## 2014-09-27 DIAGNOSIS — N921 Excessive and frequent menstruation with irregular cycle: Secondary | ICD-10-CM | POA: Diagnosis not present

## 2014-09-27 HISTORY — PX: DILITATION & CURRETTAGE/HYSTROSCOPY WITH NOVASURE ABLATION: SHX5568

## 2014-09-27 HISTORY — PX: LAPAROSCOPIC BILATERAL SALPINGECTOMY: SHX5889

## 2014-09-27 SURGERY — DILATATION & CURETTAGE/HYSTEROSCOPY WITH NOVASURE ABLATION
Anesthesia: General

## 2014-09-27 MED ORDER — NEOSTIGMINE METHYLSULFATE 10 MG/10ML IV SOLN
INTRAVENOUS | Status: AC
  Filled 2014-09-27: qty 1

## 2014-09-27 MED ORDER — PROPOFOL 10 MG/ML IV BOLUS
INTRAVENOUS | Status: AC
  Filled 2014-09-27: qty 20

## 2014-09-27 MED ORDER — NEOSTIGMINE METHYLSULFATE 10 MG/10ML IV SOLN
INTRAVENOUS | Status: DC | PRN
  Administered 2014-09-27: 4 mg via INTRAVENOUS

## 2014-09-27 MED ORDER — LIDOCAINE HCL (PF) 1 % IJ SOLN
INTRAMUSCULAR | Status: AC
  Filled 2014-09-27: qty 10

## 2014-09-27 MED ORDER — MEPERIDINE HCL 50 MG/ML IJ SOLN: 6.2500 mg | Freq: Once | INTRAMUSCULAR | Status: DC

## 2014-09-27 MED ORDER — ONDANSETRON HCL 4 MG/2ML IJ SOLN
INTRAMUSCULAR | Status: AC
  Filled 2014-09-27: qty 4

## 2014-09-27 MED ORDER — ONDANSETRON HCL 4 MG/2ML IJ SOLN
INTRAMUSCULAR | Status: AC
  Filled 2014-09-27: qty 2

## 2014-09-27 MED ORDER — BUPIVACAINE LIPOSOME 1.3 % IJ SUSP
INTRAMUSCULAR | Status: AC
  Filled 2014-09-27: qty 20

## 2014-09-27 MED ORDER — ONDANSETRON HCL 4 MG/2ML IJ SOLN
INTRAMUSCULAR | Status: DC | PRN
  Administered 2014-09-27: 4 mg via INTRAVENOUS

## 2014-09-27 MED ORDER — MIDAZOLAM HCL 2 MG/2ML IJ SOLN
1.0000 mg | INTRAMUSCULAR | Status: DC | PRN
  Administered 2014-09-27: 2 mg via INTRAVENOUS

## 2014-09-27 MED ORDER — CEFAZOLIN SODIUM-DEXTROSE 2-3 GM-% IV SOLR
INTRAVENOUS | Status: AC
  Filled 2014-09-27: qty 50

## 2014-09-27 MED ORDER — ROCURONIUM BROMIDE 50 MG/5ML IV SOLN
INTRAVENOUS | Status: AC
  Filled 2014-09-27: qty 1

## 2014-09-27 MED ORDER — KETOROLAC TROMETHAMINE 30 MG/ML IJ SOLN
INTRAMUSCULAR | Status: AC
  Filled 2014-09-27: qty 1

## 2014-09-27 MED ORDER — ROCURONIUM BROMIDE 100 MG/10ML IV SOLN: INTRAVENOUS | Status: DC | PRN

## 2014-09-27 MED ORDER — BUPIVACAINE LIPOSOME 1.3 % IJ SUSP: 20.0000 mL | Freq: Once | INTRAMUSCULAR | Status: DC

## 2014-09-27 MED ORDER — LIDOCAINE HCL (PF) 1 % IJ SOLN
INTRAMUSCULAR | Status: DC | PRN
  Administered 2014-09-27: 50 mg

## 2014-09-27 MED ORDER — 0.9 % SODIUM CHLORIDE (POUR BTL) OPTIME
TOPICAL | Status: DC | PRN
  Administered 2014-09-27: 1000 mL

## 2014-09-27 MED ORDER — CEFAZOLIN SODIUM-DEXTROSE 2-3 GM-% IV SOLR
INTRAVENOUS | Status: DC | PRN
  Administered 2014-09-27: 2 g via INTRAVENOUS

## 2014-09-27 MED ORDER — FENTANYL CITRATE (PF) 250 MCG/5ML IJ SOLN
INTRAMUSCULAR | Status: AC
  Filled 2014-09-27: qty 5

## 2014-09-27 MED ORDER — ONDANSETRON HCL 4 MG/2ML IJ SOLN
4.0000 mg | Freq: Once | INTRAMUSCULAR | Status: AC
  Administered 2014-09-27: 4 mg via INTRAVENOUS

## 2014-09-27 MED ORDER — FENTANYL CITRATE (PF) 100 MCG/2ML IJ SOLN
25.0000 ug | INTRAMUSCULAR | Status: AC
  Administered 2014-09-27: 25 ug via INTRAVENOUS

## 2014-09-27 MED ORDER — PROPOFOL 10 MG/ML IV BOLUS
INTRAVENOUS | Status: DC | PRN
  Administered 2014-09-27: 100 mg via INTRAVENOUS
  Administered 2014-09-27: 200 mg via INTRAVENOUS

## 2014-09-27 MED ORDER — GLYCOPYRROLATE 0.2 MG/ML IJ SOLN
INTRAMUSCULAR | Status: AC
  Filled 2014-09-27: qty 3

## 2014-09-27 MED ORDER — GLYCOPYRROLATE 0.2 MG/ML IJ SOLN
INTRAMUSCULAR | Status: DC | PRN
  Administered 2014-09-27: 0.6 mg via INTRAVENOUS

## 2014-09-27 MED ORDER — BUPIVACAINE LIPOSOME 1.3 % IJ SUSP
INTRAMUSCULAR | Status: DC | PRN
  Administered 2014-09-27: 20 mL

## 2014-09-27 MED ORDER — CEFAZOLIN SODIUM-DEXTROSE 2-3 GM-% IV SOLR: 2.0000 g | INTRAVENOUS | Status: DC

## 2014-09-27 MED ORDER — FENTANYL CITRATE (PF) 100 MCG/2ML IJ SOLN
INTRAMUSCULAR | Status: AC
  Administered 2014-09-27: 50 ug via INTRAVENOUS
  Filled 2014-09-27: qty 2

## 2014-09-27 MED ORDER — ROCURONIUM BROMIDE 100 MG/10ML IV SOLN
INTRAVENOUS | Status: DC | PRN
  Administered 2014-09-27: 30 mg via INTRAVENOUS

## 2014-09-27 MED ORDER — KETOROLAC TROMETHAMINE 30 MG/ML IJ SOLN
30.0000 mg | Freq: Once | INTRAMUSCULAR | Status: AC
  Administered 2014-09-27: 30 mg via INTRAVENOUS

## 2014-09-27 MED ORDER — FENTANYL CITRATE (PF) 100 MCG/2ML IJ SOLN
INTRAMUSCULAR | Status: DC | PRN
  Administered 2014-09-27 (×5): 50 ug via INTRAVENOUS

## 2014-09-27 MED ORDER — LACTATED RINGERS IV SOLN
INTRAVENOUS | Status: DC
  Administered 2014-09-27: 13:00:00 via INTRAVENOUS
  Administered 2014-09-27: 1000 mL via INTRAVENOUS
  Administered 2014-09-27: 13:00:00 via INTRAVENOUS

## 2014-09-27 MED ORDER — ONDANSETRON HCL 4 MG/2ML IJ SOLN
4.0000 mg | Freq: Once | INTRAMUSCULAR | Status: AC | PRN
  Administered 2014-09-27: 4 mg via INTRAVENOUS

## 2014-09-27 MED ORDER — FENTANYL CITRATE (PF) 100 MCG/2ML IJ SOLN
25.0000 ug | INTRAMUSCULAR | Status: DC | PRN
  Administered 2014-09-27 (×2): 50 ug via INTRAVENOUS

## 2014-09-27 MED ORDER — FENTANYL CITRATE (PF) 100 MCG/2ML IJ SOLN
INTRAMUSCULAR | Status: AC
  Filled 2014-09-27: qty 2

## 2014-09-27 MED ORDER — DEXAMETHASONE SODIUM PHOSPHATE 4 MG/ML IJ SOLN
INTRAMUSCULAR | Status: AC
  Filled 2014-09-27: qty 1

## 2014-09-27 MED ORDER — DEXAMETHASONE SODIUM PHOSPHATE 10 MG/ML IJ SOLN
INTRAMUSCULAR | Status: DC | PRN
  Administered 2014-09-27: 4 mg via INTRAVENOUS

## 2014-09-27 SURGICAL SUPPLY — 53 items
ABLATOR ENDOMETRIAL BIPOLAR (ABLATOR) ×4 IMPLANT
BAG HAMPER (MISCELLANEOUS) ×4 IMPLANT
BLADE SURG SZ11 CARB STEEL (BLADE) ×4 IMPLANT
CLOTH BEACON ORANGE TIMEOUT ST (SAFETY) ×4 IMPLANT
COVER LIGHT HANDLE STERIS (MISCELLANEOUS) ×8 IMPLANT
COVER MAYO STAND XLG (DRAPE) ×2 IMPLANT
DRAPE PROXIMA HALF (DRAPES) ×4 IMPLANT
ELECT REM PT RETURN 9FT ADLT (ELECTROSURGICAL) ×4
ELECTRODE REM PT RTRN 9FT ADLT (ELECTROSURGICAL) ×2 IMPLANT
FORMALIN 10 PREFIL 120ML (MISCELLANEOUS) ×6 IMPLANT
GAUZE SPONGE 4X4 16PLY XRAY LF (GAUZE/BANDAGES/DRESSINGS) ×2 IMPLANT
GLOVE BIO SURGEON STRL SZ7 (GLOVE) ×2 IMPLANT
GLOVE BIOGEL PI IND STRL 7.0 (GLOVE) IMPLANT
GLOVE BIOGEL PI IND STRL 8 (GLOVE) ×2 IMPLANT
GLOVE BIOGEL PI INDICATOR 7.0 (GLOVE) ×2
GLOVE BIOGEL PI INDICATOR 8 (GLOVE) ×2
GLOVE ECLIPSE 8.0 STRL XLNG CF (GLOVE) ×4 IMPLANT
GLOVE EXAM NITRILE LRG STRL (GLOVE) ×2 IMPLANT
GOWN STRL REUS W/TWL LRG LVL3 (GOWN DISPOSABLE) ×4 IMPLANT
GOWN STRL REUS W/TWL XL LVL3 (GOWN DISPOSABLE) ×4 IMPLANT
INST SET HYSTEROSCOPY (KITS) ×4 IMPLANT
INST SET LAPROSCOPIC GYN AP (KITS) ×4 IMPLANT
IV NS 1000ML (IV SOLUTION) ×4
IV NS 1000ML BAXH (IV SOLUTION) ×2 IMPLANT
KIT ROOM TURNOVER AP CYSTO (KITS) ×4 IMPLANT
KIT ROOM TURNOVER APOR (KITS) ×4 IMPLANT
LIQUID BAND (GAUZE/BANDAGES/DRESSINGS) ×2 IMPLANT
MANIFOLD NEPTUNE II (INSTRUMENTS) ×4 IMPLANT
NDL HYPO 21X1.5 SAFETY (NEEDLE) ×2 IMPLANT
NDL INSUFFLATION 14GA 120MM (NEEDLE) ×2 IMPLANT
NEEDLE HYPO 21X1.5 SAFETY (NEEDLE) ×4 IMPLANT
NEEDLE INSUFFLATION 14GA 120MM (NEEDLE) ×4 IMPLANT
NS IRRIG 1000ML POUR BTL (IV SOLUTION) ×4 IMPLANT
PACK PERI GYN (CUSTOM PROCEDURE TRAY) ×4 IMPLANT
PAD ARMBOARD 7.5X6 YLW CONV (MISCELLANEOUS) ×4 IMPLANT
PAD TELFA 3X4 1S STER (GAUZE/BANDAGES/DRESSINGS) ×4 IMPLANT
SCALPEL HARMONIC ACE (MISCELLANEOUS) ×4 IMPLANT
SET BASIN LINEN APH (SET/KITS/TRAYS/PACK) ×4 IMPLANT
SET IRRIG Y TYPE TUR BLADDER L (SET/KITS/TRAYS/PACK) ×4 IMPLANT
SLEEVE ENDOPATH XCEL 5M (ENDOMECHANICALS) ×4 IMPLANT
SOLUTION ANTI FOG 6CC (MISCELLANEOUS) ×4 IMPLANT
STAPLER VISISTAT 35W (STAPLE) ×2 IMPLANT
SUT VIC AB 3-0 SH 27 (SUTURE) ×8
SUT VIC AB 3-0 SH 27X BRD (SUTURE) IMPLANT
SUT VICRYL 0 UR6 27IN ABS (SUTURE) ×4 IMPLANT
SYR 20CC LL (SYRINGE) ×4 IMPLANT
SYR BULB IRRIGATION 50ML (SYRINGE) ×2 IMPLANT
SYR CONTROL 10ML LL (SYRINGE) ×4 IMPLANT
SYRINGE 10CC LL (SYRINGE) ×4 IMPLANT
TROCAR ENDO BLADELESS 11MM (ENDOMECHANICALS) ×4 IMPLANT
TROCAR XCEL NON-BLD 5MMX100MML (ENDOMECHANICALS) ×4 IMPLANT
TUBING INSUF HEATED (TUBING) ×4 IMPLANT
WARMER LAPAROSCOPE (MISCELLANEOUS) ×4 IMPLANT

## 2014-09-27 NOTE — Progress Notes (Signed)
EKG done NSR

## 2014-09-27 NOTE — Progress Notes (Signed)
Resting quietly. Denies pain. States chest pressure is gone.

## 2014-09-27 NOTE — Progress Notes (Signed)
Dr Despina HiddenEure at bedside to check pt. No new orders given. Ok to proceed with surgery.

## 2014-09-27 NOTE — Progress Notes (Signed)
Talked with Dr Jayme CloudGonzalez on phone. EKG order given.

## 2014-09-27 NOTE — Discharge Instructions (Signed)
Laparoscopic Tubal Ligation °Care After °Refer to this sheet in the next few weeks. These instructions provide you with information on caring for yourself after your procedure. Your caregiver may also give you more specific instructions. Your treatment has been planned according to current medical practices, but problems sometimes occur. Call your caregiver if you have any problems or questions after your procedure. °HOME CARE INSTRUCTIONS  °· Rest the remainder of the day. °· Only take over-the-counter or prescription medicines for pain, discomfort, or fever as directed by your caregiver. Do not take aspirin. It can cause bleeding. °· Gradually resume daily activities, diet, rest, driving, and work. °· Avoid sexual intercourse for 2 weeks or as directed. °· Do not use tampons or douche. °· Do not drive while taking pain medicine. °· Do not lift anything over 5 pounds for 2 weeks or as directed. °· Only take showers, not baths, until you are seen by your caregiver. °· Change bandages (dressings) as directed. °· Take your temperature twice a day and record it. °· Try to have help for the first 7 to 10 days for your household needs. °· Return to your caregiver to get your stitches (sutures) removed and for follow-up visits as directed. °SEEK MEDICAL CARE IF:  °· You have redness, swelling, or increasing pain in a wound. °· You have drainage from a wound lasting longer than 1 day. °· Your pain is getting worse. °· You have a rash. °· You become dizzy or lightheaded. °· You have a reaction to your medicine. °· You need stronger medicine or a change in your pain medicine. °· You notice a bad smell coming from a wound or dressing. °· Your wound breaks open after the sutures have been removed. °· You are constipated. °SEEK IMMEDIATE MEDICAL CARE IF:  °· You faint. °· You have a fever. °· You have increasing abdominal pain. °· You have severe pain in your shoulders. °· You have bleeding or drainage from the suture sites or  vagina following surgery. °· You have shortness of breath or difficulty breathing. °· You have chest or leg pain. °· You have persistent nausea, vomiting, or diarrhea. °MAKE SURE YOU:  °· Understand these instructions. °· Watch your condition. °· Get help right away if you are not doing well or get worse. °Document Released: 12/13/2004 Document Revised: 11/25/2011 Document Reviewed: 09/06/2011 °[employer]® Patient Information ©2015 [employer]. This information is not intended to replace advice given to you by your health care provider. Make sure you discuss any questions you have with your health care provider. ° °

## 2014-09-27 NOTE — Progress Notes (Signed)
Dr Gonzalez at bedside to check pt. No new orders given. 

## 2014-09-27 NOTE — Op Note (Signed)
Preoperative Diagnosis:  1.  Multiparous female desires permanent sterilization                                          2.  Elects to have bilateral salpingectomy for ovarian cancer                                                     prophylaxis  Postoperative Diagnosis:  Same as above  Procedure:  Laparoscopic Bilateral Salpingectomy  Surgeon:  Rockne Coons MD  Anaesthesia: general  Findings:  Patient had normal pelvic anatomy and no intraperitoneal abnormalities.  Description of Operation:  Patient was taken to the OR and placed into supine position where she underwent general anaesthesia.  She was placed in the dorsal lithotomy position and prepped and draped in the usual sterile fashion.  An incision was made in the umbilicus and dissection taken down to the rectus fascia which was incised and opened.  The non bladed trocar was then placed and the peritoneal cavity was insufflated.  The above noted findings were observed.  Additional trocars were placed in the right and left lower quadrants under direct visualization without difficulty.  The Harmonic scalpel was employed and salpingectomy of both the right and left tubes was performed.   The tubes were removed from the peritoneal cavity and sent to pathology.  There was good hemostasis bilaterally.  The fascia, peritoneum and subcutaneous tissue were closed using 0 vicryl.  The skin was with subcutaneous 3-0 vicryl.  Exparel 266 mg 20 cc was injected in the 3 incision trocar sites. The patient was awakened from anaesthesia and taken to the PACU with all counts being correct x 3.  The patient received  2 gram of ancef andToradol 30 mg IV preoperatively.  Lazaro Arms 09/27/2014 2:09 PM   Preoperative diagnosis: Menometrorrhagia                                        Dysmenorrhea                                        Postoperative diagnoses: Same as above   Procedure: Hysteroscopy, uterine curettage, failed endometrial ablation  using Novasure  Surgeon: Lazaro Arms   Anesthesia: Laryngeal mask airway  Findings: The endometrium was normal.  Description of operation: The patient was taken to the operating room and placed in the supine position. She underwent general anesthesia using the laryngeal mask airway. She was placed in the dorsal lithotomy position and prepped and draped in the usual sterile fashion. A Graves speculum was placed and the anterior cervical lip was grasped with a single-tooth tenaculum. The cervix was dilated serially to allow passage of the hysteroscope. Diagnostic hysteroscopy was performed and was found to be normal. A vigorous uterine curettage was then performed and all tissue sent to pathology for evaluation.  I then proceeded to perform the Novasure endometrial ablation.  The cervical length was 3.5. The uterus sounded to  9.0 cm yielding a net length of 5.54.5  cm.  The endometrial cavity was 4.5 cm wide. The power was 124 watts.  However despite 7 tries with various and sundry attempts at sealing the cervix I was unable to limit the CO2 backflow and the procedure could not be done. I assume the reason is she is relatively recently post partum, the cervix was quite patulous and I used 3 tenaculums and closed quite high   .  The patient was awakened from anesthesia and taken to the recovery room in good stable condition all counts were correct. She received 2 g of Ancef and 30 mg of Toradol preoperatively. She will be discharged from the recovery room and followed up in the office in 1- 2 weeks.  Jaelyne Deeg H 09/27/2014 2:09 PM

## 2014-09-27 NOTE — H&P (Signed)
Preoperative History and Physical  Rita Oconnor is a 29 y.o. [phone] with No LMP recorded. admitted for a Laparoscopic bilateral salpingectomy for sterilization and hysteroscopy uterine curettage endometrial ablation using NovaSure.  She chooseds bilateral salpingectomy for sterilization procedure.  She also has a lifelong history of heavy clotting painful periods which have gotten progressivley worse after her kids, so we will proceed with endometrial ablation using NovaSure  PMH:    Past Medical History  Diagnosis Date  . Headache(784.0)     PSH:    History reviewed. No pertinent past surgical history.  POb/GynH:      OB History    Gravida Para Term Preterm AB TAB SAB Ectopic Multiple Living   0 4      SH:   History  Substance Use Topics  . Smoking status: Never Smoker   . Smokeless tobacco: Never Used  . Alcohol Use: No    FH:    Family History  Problem Relation Age of Onset  . Cancer Paternal Aunt     breast  . Coronary artery disease Maternal Grandfather      Allergies: No Known Allergies  Medications:       Current facility-administered medications:  .  bupivacaine liposome (EXPAREL) 1.3 % injection 266 mg, 20 mL, Infiltration, Once, Lazaro Arms, MD .  ceFAZolin (ANCEF) IVPB 2 g/50 mL premix, 2 g, Intravenous, On Call to OR, Lazaro Arms, MD .  lactated ringers infusion, , Intravenous, Continuous, Laurene Footman, MD, Last Rate: 75 mL/hr at 09/27/14 1152, 1,000 mL at 09/27/14 1152 .  midazolam (VERSED) injection 1-2 mg, 1-2 mg, Intravenous, Q5 Min x 3 PRN, Laurene Footman, MD, 2 mg at 09/27/14 1152  Review of Systems:   Review of Systems  Constitutional: Negative for fever, chills, weight loss, malaise/fatigue and diaphoresis.  HENT: Negative for hearing loss, ear pain, nosebleeds, congestion, sore throat, neck pain, tinnitus and ear discharge.   Eyes: Negative for blurred vision, double vision, photophobia, pain, discharge and redness.   Respiratory: Negative for cough, hemoptysis, sputum production, shortness of breath, wheezing and stridor.   Cardiovascular: Negative for chest pain, palpitations, orthopnea, claudication, leg swelling and PND.  Gastrointestinal: Positive for abdominal pain. Negative for heartburn, nausea, vomiting, diarrhea, constipation, blood in stool and melena.  Genitourinary: Negative for dysuria, urgency, frequency, hematuria and flank pain.  Musculoskeletal: Negative for myalgias, back pain, joint pain and falls.  Skin: Negative for itching and rash.  Neurological: Negative for dizziness, tingling, tremors, sensory change, speech change, focal weakness, seizures, loss of consciousness, weakness and headaches.  Endo/Heme/Allergies: Negative for environmental allergies and polydipsia. Does not bruise/bleed easily.  Psychiatric/Behavioral: Negative for depression, suicidal ideas, hallucinations, memory loss and substance abuse. The patient is not nervous/anxious and does not have insomnia.      PHYSICAL EXAM:  Blood pressure 130/86, temperature 98 F (36.7 C), resp. rate 13, SpO2 100 %, currently breastfeeding.    Vitals reviewed. Constitutional: She is oriented to person, place, and time. She appears well-developed and well-nourished.  HENT:  Head: Normocephalic and atraumatic.  Right Ear: External ear normal.  Left Ear: External ear normal.  Nose: Nose normal.  Mouth/Throat: Oropharynx is clear and moist.  Eyes: Conjunctivae and EOM are normal. Pupils are equal, round, and reactive to light. Right eye exhibits no discharge. Left eye exhibits no discharge. No scleral icterus.  Neck: Normal range of motion. Neck supple. No tracheal deviation present. No thyromegaly present.  Cardiovascular: Normal rate, regular rhythm, normal heart sounds and intact distal pulses.  Exam reveals no gallop and no friction rub.   No murmur heard. Respiratory: Effort normal and breath sounds normal. No respiratory  distress. She has no wheezes. She has no rales. She exhibits no tenderness.  GI: Soft. Bowel sounds are normal. She exhibits no distension and no mass. There is tenderness. There is no rebound and no guarding.  Genitourinary:       Vulva is normal without lesions Vagina is pink moist without discharge Cervix normal in appearance and pap is normal Uterus is normal size, contour, position, consistency, mobility, non-tender Adnexa is negative with normal sized ovaries by sonogram  Musculoskeletal: Normal range of motion. She exhibits no edema and no tenderness.  Neurological: She is alert and oriented to person, place, and time. She has normal reflexes. She displays normal reflexes. No cranial nerve deficit. She exhibits normal muscle tone. Coordination normal.  Skin: Skin is warm and dry. No rash noted. No erythema. No pallor.  Psychiatric: She has a normal mood and affect. Her behavior is normal. Judgment and thought content normal.    Labs: Results for orders placed or performed during the hospital encounter of 09/22/14 (from the past 336 hour(s))  CBC   Collection Time: 09/22/14 10:30 AM  Result Value Ref Range   WBC 7.9 4.0 - 10.5 K/uL   RBC 4.18 3.87 - 5.11 MIL/uL   Hemoglobin 12.1 12.0 - 15.0 g/dL   HCT 16.1 09.6 - 04.5 %   MCV 93.1 78.0 - 100.0 fL   MCH 28.9 26.0 - 34.0 pg   MCHC 31.1 30.0 - 36.0 g/dL   RDW 40.9 81.1 - 91.4 %   Platelets 263 150 - 400 K/uL  Comprehensive metabolic panel   Collection Time: 09/22/14 10:30 AM  Result Value Ref Range   Sodium 140 135 - 145 mmol/L   Potassium 4.5 3.5 - 5.1 mmol/L   Chloride 106 96 - 112 mmol/L   CO2 26 19 - 32 mmol/L   Glucose, Bld 93 70 - 99 mg/dL   BUN 19 6 - 23 mg/dL   Creatinine, Ser 7.82 0.50 - 1.10 mg/dL   Calcium 9.7 8.4 - 95.6 mg/dL   Total Protein 7.6 6.0 - 8.3 g/dL   Albumin 4.4 3.5 - 5.2 g/dL   AST 17 0 - 37 U/L   ALT 18 0 - 35 U/L   Alkaline Phosphatase 48 39 - 117 U/L   Total Bilirubin 0.5 0.3 - 1.2 mg/dL    GFR calc non Af Amer >90 >90 mL/min   GFR calc Af Amer >90 >90 mL/min   Anion gap 8 5 - 15  hCG, quantitative, pregnancy   Collection Time: 09/22/14 10:30 AM  Result Value Ref Range   hCG, Beta Chain, Quant, S <1 <5 mIU/mL  Urinalysis, Routine w reflex microscopic   Collection Time: 09/22/14 10:30 AM  Result Value Ref Range   Color, Urine YELLOW YELLOW   APPearance CLEAR CLEAR   Specific Gravity, Urine >1.030 (H) 1.005 - 1.030   pH 5.0 5.0 - 8.0   Glucose, UA NEGATIVE NEGATIVE mg/dL   Hgb urine dipstick NEGATIVE NEGATIVE   Bilirubin Urine NEGATIVE NEGATIVE   Ketones, ur NEGATIVE NEGATIVE mg/dL   Protein, ur NEGATIVE NEGATIVE mg/dL   Urobilinogen, UA 0.2 0.0 - 1.0 mg/dL   Nitrite NEGATIVE NEGATIVE   Leukocytes, UA TRACE (A) NEGATIVE  Urine microscopic-add on   Collection Time: 09/22/14 10:30 AM  Result Value Ref Range   Squamous Epithelial / LPF RARE RARE   WBC, UA 0-2 <3 WBC/hpf    EKG: Orders placed or performed during the hospital encounter of 09/27/14  . EKG 12-Lead  . EKG 12-Lead  . EKG 12-Lead  . EKG 12-Lead    Imaging Studies: No results found.    Assessment: Desires sterilization, chooses bilateral salpingectomy Menorrhagia and dysmenorrhea  Patient Active Problem List   Diagnosis Date Noted  . Anemia during pregnancy in third trimester 05/08/2014    Plan: Laparoscopic bilateral salpingectomy for sterilization and hysteroscopy uterine curettage endometrial ablation using NovaSure  Luiz Trumpower H 09/27/2014 12:36 PM

## 2014-09-27 NOTE — Transfer of Care (Signed)
Immediate Anesthesia Transfer of Care Note  Patient: Rita Oconnor  Procedure(s) Performed: Procedure(s): HYSTEROSCOPY, UTERINE CURETTAGE,  FAILED ENDOMETRIAL ABLATION (Procedure #2)  (N/A) LAPAROSCOPIC BILATERAL SALPINGECTOMY (Procedure #1) (Bilateral)  Patient Location: PACU  Anesthesia Type:General  Level of Consciousness: awake, oriented and patient cooperative  Airway & Oxygen Therapy: Patient Spontanous Breathing and Patient connected to face mask oxygen  Post-op Assessment: Report given to RN and Post -op Vital signs reviewed and stable  Post vital signs: Reviewed and stable  Last Vitals:  Filed Vitals:   09/27/14 1424  BP: 121/76  Pulse: 63  Temp: 36.8 C  Resp: 12    Complications: No apparent anesthesia complications

## 2014-09-27 NOTE — Progress Notes (Signed)
Awake. C/O chest pain. Rates pain 4. States pressure in chest. NSR on monitor. O2 via nasal cannula started. Skin warm and dry. Denies nausea. Dr Jayme CloudGonzalez paged.

## 2014-09-27 NOTE — Progress Notes (Signed)
Dr Despina HiddenEure notified of pt condition. No new orders given.

## 2014-09-27 NOTE — Progress Notes (Signed)
Breastfeeding her baby.

## 2014-09-27 NOTE — Progress Notes (Signed)
Resting quietly. Rates chest pressure 4. States not improved.

## 2014-09-27 NOTE — Anesthesia Postprocedure Evaluation (Signed)
  Anesthesia Post-op Note  Patient: Rita Oconnor  Procedure(s) Performed: Procedure(s): HYSTEROSCOPY, UTERINE CURETTAGE,  FAILED ENDOMETRIAL ABLATION (Procedure #2)  (N/A) LAPAROSCOPIC BILATERAL SALPINGECTOMY (Procedure #1) (Bilateral)  Patient Location: PACU  Anesthesia Type:General  Level of Consciousness: awake, alert , oriented and patient cooperative  Airway and Oxygen Therapy: Patient Spontanous Breathing and Patient connected to face mask oxygen  Post-op Pain: mild  Post-op Assessment: Post-op Vital signs reviewed, Patient's Cardiovascular Status Stable, Respiratory Function Stable, Patent Airway, No signs of Nausea or vomiting and Pain level controlled  Post-op Vital Signs: Reviewed and stable  Last Vitals:  Filed Vitals:   09/27/14 1430  BP: 128/75  Pulse: 62  Temp:   Resp: 16    Complications: No apparent anesthesia complications

## 2014-09-27 NOTE — Progress Notes (Signed)
Dr. Despina HiddenEure notified of pt. Having urge to void with no results. MD stated that we may perform an I&O cath then send her home. Pt. & family informed. Does not want to be catheterized.  Pt. educated that if she has trouble voiding at home to come to ED.

## 2014-09-27 NOTE — Anesthesia Procedure Notes (Addendum)
Procedure Name: LMA Insertion Date/Time: 09/27/2014 12:52 PM Performed by: Patrcia DollyMOSES, Eliette Drumwright Pre-anesthesia Checklist: Patient identified, Timeout performed, Emergency Drugs available, Suction available and Patient being monitored Patient Re-evaluated:Patient Re-evaluated prior to inductionOxygen Delivery Method: Circle system utilized Preoxygenation: Pre-oxygenation with 100% oxygen Intubation Type: IV induction LMA Size: 4.0 Placement Confirmation: positive ETCO2 and breath sounds checked- equal and bilateral Tube secured with: Tape   Procedure Name: Intubation Date/Time: 09/27/2014 1:05 PM Performed by: Patrcia DollyMOSES, Nikolaj Geraghty Pre-anesthesia Checklist: Suction available Patient Re-evaluated:Patient Re-evaluated prior to inductionOxygen Delivery Method: Circle System Utilized Preoxygenation: Pre-oxygenation with 100% oxygen Intubation Type: IV induction Ventilation: Mask ventilation without difficulty Laryngoscope Size: Miller and 2 Grade View: Grade I Tube type: Oral Tube size: 7.0 mm Number of attempts: 1 Airway Equipment and Method: Stylet Placement Confirmation: ETT inserted through vocal cords under direct vision,  positive ETCO2 and breath sounds checked- equal and bilateral Secured at: 21 cm Tube secured with: Tape Dental Injury: Teeth and Oropharynx as per pre-operative assessment

## 2014-09-27 NOTE — Anesthesia Preprocedure Evaluation (Addendum)
Anesthesia Evaluation  Patient identified by MRN, date of birth, ID band Patient awake    Reviewed: Allergy & Precautions, H&P , Patient's Chart, lab work & pertinent test results  Airway Mallampati: III  TM Distance: >3 FB Neck ROM: full    Dental no notable dental hx. (+) Teeth Intact   Pulmonary neg pulmonary ROS,  breath sounds clear to auscultation  Pulmonary exam normal       Cardiovascular negative cardio ROS  Rhythm:regular Rate:Normal     Neuro/Psych  Headaches, negative psych ROS   GI/Hepatic Neg liver ROS,   Endo/Other  negative endocrine ROS  Renal/GU negative Renal ROS  negative genitourinary   Musculoskeletal   Abdominal Normal abdominal exam  (+)   Peds  Hematology negative hematology ROS (+)   Anesthesia Other Findings   Reproductive/Obstetrics                             Anesthesia Physical Anesthesia Plan  ASA: II  Anesthesia Plan: General   Post-op Pain Management:    Induction: Intravenous  Airway Management Planned: Oral ETT  Additional Equipment:   Intra-op Plan:   Post-operative Plan: Extubation in OR  Informed Consent: I have reviewed the patients History and Physical, chart, labs and discussed the procedure including the risks, benefits and alternatives for the proposed anesthesia with the patient or authorized representative who has indicated his/her understanding and acceptance.     Plan Discussed with:   Anesthesia Plan Comments:        Anesthesia Quick Evaluation

## 2014-09-28 ENCOUNTER — Encounter (HOSPITAL_COMMUNITY): Payer: Self-pay | Admitting: Obstetrics & Gynecology

## 2014-10-02 ENCOUNTER — Emergency Department (HOSPITAL_COMMUNITY)
Admission: EM | Admit: 2014-10-02 | Discharge: 2014-10-03 | Disposition: A | Discharge status: Home / Self Care | Payer: Medicaid Other | Attending: Emergency Medicine | Admitting: Emergency Medicine

## 2014-10-02 ENCOUNTER — Encounter (HOSPITAL_COMMUNITY): Payer: Self-pay | Admitting: *Deleted

## 2014-10-02 DIAGNOSIS — R1030 Lower abdominal pain, unspecified: Secondary | ICD-10-CM | POA: Diagnosis not present

## 2014-10-02 DIAGNOSIS — Z791 Long term (current) use of non-steroidal anti-inflammatories (NSAID): Secondary | ICD-10-CM | POA: Diagnosis not present

## 2014-10-02 DIAGNOSIS — R531 Weakness: Secondary | ICD-10-CM | POA: Insufficient documentation

## 2014-10-02 DIAGNOSIS — R269 Unspecified abnormalities of gait and mobility: Secondary | ICD-10-CM | POA: Diagnosis not present

## 2014-10-02 DIAGNOSIS — R6889 Other general symptoms and signs: Secondary | ICD-10-CM

## 2014-10-02 DIAGNOSIS — K5900 Constipation, unspecified: Secondary | ICD-10-CM | POA: Diagnosis not present

## 2014-10-02 DIAGNOSIS — R5383 Other fatigue: Secondary | ICD-10-CM | POA: Insufficient documentation

## 2014-10-02 DIAGNOSIS — R1013 Epigastric pain: Secondary | ICD-10-CM | POA: Insufficient documentation

## 2014-10-02 DIAGNOSIS — R509 Fever, unspecified: Secondary | ICD-10-CM | POA: Insufficient documentation

## 2014-10-02 DIAGNOSIS — R51 Headache: Secondary | ICD-10-CM | POA: Diagnosis not present

## 2014-10-02 DIAGNOSIS — R110 Nausea: Secondary | ICD-10-CM | POA: Diagnosis not present

## 2014-10-02 LAB — CBC WITH DIFFERENTIAL/PLATELET
BASOS PCT: 0 % (ref 0–1)
Basophils Absolute: 0 10*3/uL (ref 0.0–0.1)
EOS ABS: 0 10*3/uL (ref 0.0–0.7)
Eosinophils Relative: 0 % (ref 0–5)
HCT: 38.3 % (ref 36.0–46.0)
Hemoglobin: 12.1 g/dL (ref 12.0–15.0)
Lymphocytes Relative: 10 % — ABNORMAL LOW (ref 12–46)
Lymphs Abs: 0.9 10*3/uL (ref 0.7–4.0)
MCH: 28.8 pg (ref 26.0–34.0)
MCHC: 31.6 g/dL (ref 30.0–36.0)
MCV: 91.2 fL (ref 78.0–100.0)
MONO ABS: 0.7 10*3/uL (ref 0.1–1.0)
MONOS PCT: 8 % (ref 3–12)
Neutro Abs: 7.6 10*3/uL (ref 1.7–7.7)
Neutrophils Relative %: 82 % — ABNORMAL HIGH (ref 43–77)
PLATELETS: 235 10*3/uL (ref 150–400)
RBC: 4.2 MIL/uL (ref 3.87–5.11)
RDW: 15.4 % (ref 11.5–15.5)
WBC: 9.2 10*3/uL (ref 4.0–10.5)

## 2014-10-02 LAB — I-STAT CG4 LACTIC ACID, ED: LACTIC ACID, VENOUS: 1.75 mmol/L (ref 0.5–2.0)

## 2014-10-02 LAB — COMPREHENSIVE METABOLIC PANEL
ALBUMIN: 4.3 g/dL (ref 3.5–5.2)
ALT: 24 U/L (ref 0–35)
AST: 19 U/L (ref 0–37)
Alkaline Phosphatase: 57 U/L (ref 39–117)
Anion gap: 10 (ref 5–15)
BILIRUBIN TOTAL: 0.6 mg/dL (ref 0.3–1.2)
BUN: 16 mg/dL (ref 6–23)
CO2: 23 mmol/L (ref 19–32)
CREATININE: 0.69 mg/dL (ref 0.50–1.10)
Calcium: 8.9 mg/dL (ref 8.4–10.5)
Chloride: 103 mmol/L (ref 96–112)
GFR calc Af Amer: >90 mL/min (ref >90)
GLUCOSE: 112 mg/dL — AB (ref 70–99)
Potassium: 3.8 mmol/L (ref 3.5–5.1)
Sodium: 136 mmol/L (ref 135–145)
TOTAL PROTEIN: 7.8 g/dL (ref 6.0–8.3)

## 2014-10-02 MED ORDER — KETOROLAC TROMETHAMINE 30 MG/ML IJ SOLN
30.0000 mg | Freq: Once | INTRAMUSCULAR | Status: AC
  Administered 2014-10-02: 30 mg via INTRAVENOUS
  Filled 2014-10-02: qty 1

## 2014-10-02 MED ORDER — ONDANSETRON HCL 4 MG/2ML IJ SOLN: 4.0000 mg | Freq: Once | INTRAMUSCULAR | Status: DC

## 2014-10-02 MED ORDER — PROMETHAZINE HCL 25 MG/ML IJ SOLN
12.5000 mg | Freq: Once | INTRAMUSCULAR | Status: AC
  Administered 2014-10-02: 12.5 mg via INTRAVENOUS
  Filled 2014-10-02: qty 1

## 2014-10-02 MED ORDER — SODIUM CHLORIDE 0.9 % IV SOLN
1000.0000 mL | Freq: Once | INTRAVENOUS | Status: AC
  Administered 2014-10-02: 1000 mL via INTRAVENOUS

## 2014-10-02 NOTE — ED Notes (Signed)
Family reports pt has been nauseated and dizzy starting today.  Reports having surgery last Wednesday.  Pt reports taking nausea medication about 3:30 this afternoon.  Denies taking pain medications today.  Pt somewhat lethargic during triage.

## 2014-10-02 NOTE — ED Provider Notes (Signed)
CSN: [id number]     Arrival date & time 10/02/14  1846 History  This chart was scribed for Dione Booze, MD by Tanda Rockers, ED Scribe. This patient was seen in room APA08/[facility]08 and the patient's care was started at 11:05 PM.    Chief Complaint  Patient presents with  . Dizziness  . Nausea   The history is provided by the patient. No language interpreter was used.     HPI Comments: Rita Oconnor is a 29 y.o. female who presents to the Emergency Department complaining of  Sudden onset nausea that began earlier today. Pt took Zofran around 3:30 today with no relief. Shortly after taking medication, she began having a headache, fatigue, generalized weakness, and difficulty walking. She reports that she also had a subjective fever. Pt's temperature upon arrival was 100.9. She also complains of constipation. She reports that she had tubal ligation and hernia repair last Wednesday,  4/20 (5 days ago). Denies vomiting, rhinorrhea, nasal congestion, sore throat, cough, diarrhea, urinary issues, or any other symptoms. No recent sick contact with similar symptoms. Pt is currently breastfeeding.    Past Medical History  Diagnosis Date  . [id number](478.2)    Past Surgical History  Procedure Laterality Date  . Dilitation & currettage/hystroscopy with novasure ablation N/A 09/27/2014    Procedure: HYSTEROSCOPY, UTERINE CURETTAGE,  FAILED ENDOMETRIAL ABLATION (Procedure #2) ;  Surgeon: Lazaro Arms, MD;  Location: [facility];  Service: Gynecology;  Laterality: N/A;  . Laparoscopic bilateral salpingectomy Bilateral 09/27/2014    Procedure: LAPAROSCOPIC BILATERAL SALPINGECTOMY (Procedure #1);  Surgeon: Lazaro Arms, MD;  Location: [facility];  Service: Gynecology;  Laterality: Bilateral;   Family History  Problem Relation Age of Onset  . Cancer Paternal Aunt     breast  . Coronary artery disease Maternal Grandfather    History  Substance Use Topics  . Smoking status: Never Smoker   . Smokeless tobacco:  Never Used  . Alcohol Use: No   OB History    Gravida Para Term Preterm AB TAB SAB Ectopic Multiple Living   0 4     Review of Systems  Constitutional: Positive for fever and fatigue.  HENT: Negative for congestion, rhinorrhea and sore throat.   Respiratory: Negative for cough.   Gastrointestinal: Positive for constipation.  Genitourinary: Negative for dysuria, hematuria and difficulty urinating.  Musculoskeletal: Positive for gait problem.  All other systems reviewed and are negative.     Allergies  Review of patient's allergies indicates no known allergies.  Home Medications   Prior to Admission medications   Medication Sig Start Date End Date Taking? Authorizing Provider  HYDROcodone-acetaminophen (NORCO/VICODIN) 5-325 MG per tablet Take 1 tablet by mouth every 6 (six) hours as needed. 09/14/14   Lazaro Arms, MD  ibuprofen (ADVIL,MOTRIN) 600 MG tablet Take 1 tablet (600 mg total) by mouth every 6 (six) hours. Patient not taking: Reported on 08/24/2014 07/27/14   Myra Rude, MD  ketorolac (TORADOL) 10 MG tablet Take 1 tablet (10 mg total) by mouth every 8 (eight) hours as needed. 09/14/14   Lazaro Arms, MD  ondansetron (ZOFRAN) 8 MG tablet Take 1 tablet (8 mg total) by mouth every 8 (eight) hours as needed for nausea. 09/14/14   Lazaro Arms, MD   Triage Vitals: BP 95/59 mmHg  Pulse 93  Temp(Src) 99.8 F (37.7 C) (Oral)  Resp 20  Ht  (1.803 m)  Wt 180  lb (81.647 kg)  BMI 25.12 kg/m2  SpO2 98%   Physical Exam  Constitutional: She is oriented to person, place, and time. She appears well-developed and well-nourished. No distress.  HENT:  Head: Normocephalic and atraumatic.  Eyes: Conjunctivae and EOM are normal. Pupils are equal, round, and reactive to light.  Neck: Normal range of motion. Neck supple. No JVD present.  Cardiovascular: Normal rate, regular rhythm and normal heart sounds.   No murmur heard. Pulmonary/Chest: Effort normal and breath  sounds normal. She has no wheezes. She has no rales. She exhibits no tenderness.  Abdominal: Soft. She exhibits no distension and no mass. There is tenderness (Mild suprapubic and epigastric tenderness. ). There is no rebound and no guarding.  Scars from recent laproscopic surgery; Periumbilical and both lower quadrants healing well with moderate tenderness.  Bowel sounds decreased.   Musculoskeletal: Normal range of motion. She exhibits no edema.  Lymphadenopathy:    She has no cervical adenopathy.  Neurological: She is alert and oriented to person, place, and time. No cranial nerve deficit. She exhibits normal muscle tone. Coordination normal.  Skin: Skin is warm and dry. No rash noted.  Psychiatric: She has a normal mood and affect. Her behavior is normal. Judgment and thought content normal.  Nursing note and vitals reviewed.   ED Course  Procedures (including critical care time)  DIAGNOSTIC STUDIES: Oxygen Saturation is 98% on RA, normal by my interpretation.    COORDINATION OF CARE: 11:11 PM-Discussed treatment plan which includes UA with pt at bedside and pt agreed to plan.   Labs Review Results for orders placed or performed during the hospital encounter of 10/02/14  Urinalysis with microscopic  Result Value Ref Range   Color, Urine YELLOW YELLOW   APPearance HAZY (A) CLEAR   Specific Gravity, Urine >1.030 (H) 1.005 - 1.030   pH 5.5 5.0 - 8.0   Glucose, UA NEGATIVE NEGATIVE mg/dL   Hgb urine dipstick MODERATE (A) NEGATIVE   Bilirubin Urine NEGATIVE NEGATIVE   Ketones, ur NEGATIVE NEGATIVE mg/dL   Protein, ur NEGATIVE NEGATIVE mg/dL   Urobilinogen, UA 0.2 0.0 - 1.0 mg/dL   Nitrite NEGATIVE NEGATIVE   Leukocytes, UA NEGATIVE NEGATIVE  Comprehensive metabolic panel  Result Value Ref Range   Sodium 136 135 - 145 mmol/L   Potassium 3.8 3.5 - 5.1 mmol/L   Chloride 103 96 - 112 mmol/L   CO2 23 19 - 32 mmol/L   Glucose, Bld 112 (H) 70 - 99 mg/dL   BUN 16 6 - 23 mg/dL    Creatinine, Ser 1.610.69 0.50 - 1.10 mg/dL   Calcium 8.9 8.4 - 09.610.5 mg/dL   Total Protein 7.8 6.0 - 8.3 g/dL   Albumin 4.3 3.5 - 5.2 g/dL   AST 19 0 - 37 U/L   ALT 24 0 - 35 U/L   Alkaline Phosphatase 57 39 - 117 U/L   Total Bilirubin 0.6 0.3 - 1.2 mg/dL   GFR calc non Af Amer >90 >90 mL/min   GFR calc Af Amer >90 >90 mL/min   Anion gap 10 5 - 15  CBC WITH DIFFERENTIAL  Result Value Ref Range   WBC 9.2 4.0 - 10.5 K/uL   RBC 4.20 3.87 - 5.11 MIL/uL   Hemoglobin 12.1 12.0 - 15.0 g/dL   HCT 04.538.3 40.936.0 - 81.146.0 %   MCV 91.2 78.0 - 100.0 fL   MCH 28.8 26.0 - 34.0 pg   MCHC 31.6 30.0 - 36.0 g/dL   RDW  15.4 11.5 - 15.5 %   Platelets 235 150 - 400 K/uL   Neutrophils Relative % 82 (H) 43 - 77 %   Neutro Abs 7.6 1.7 - 7.7 K/uL   Lymphocytes Relative 10 (L) 12 - 46 %   Lymphs Abs 0.9 0.7 - 4.0 K/uL   Monocytes Relative 8 3 - 12 %   Monocytes Absolute 0.7 0.1 - 1.0 K/uL   Eosinophils Relative 0 0 - 5 %   Eosinophils Absolute 0.0 0.0 - 0.7 K/uL   Basophils Relative 0 0 - 1 %   Basophils Absolute 0.0 0.0 - 0.1 K/uL  Urine microscopic-add on  Result Value Ref Range   Squamous Epithelial / LPF MANY (A) RARE   WBC, UA 0-2 <3 WBC/hpf   RBC / HPF 7-10 <3 RBC/hpf   Bacteria, UA MANY (A) RARE   Urine-Other MUCOUS PRESENT   I-Stat CG4 Lactic Acid, ED  Result Value Ref Range   Lactic Acid, Venous 1.75 0.5 - 2.0 mmol/L    MDM   Final diagnoses:  Flu-like symptoms    Flulike illness with fever, nausea, myalgias. She's given IV hydration as well as meclizine for nausea and ketorolac. Following this, she feels much better. Laboratory workup is reassuring. WBC is normal. Urinalysis is a contaminated specimen and with concentrated urine to suggest some degree of dehydration. Lactic acid level is normal. She is reassured and is discharged with prescription for promethazine. Advised to use over-the-counter ibuprofen as needed.   I personally performed the services described in this documentation, which  was scribed in my presence. The recorded information has been reviewed and is accurate.       Dione Booze, MD 10/03/14 [phone]

## 2014-10-03 LAB — URINALYSIS, ROUTINE W REFLEX MICROSCOPIC
Bilirubin Urine: NEGATIVE
GLUCOSE, UA: NEGATIVE mg/dL
Ketones, ur: NEGATIVE mg/dL
LEUKOCYTES UA: NEGATIVE
Nitrite: NEGATIVE
PROTEIN: NEGATIVE mg/dL
UROBILINOGEN UA: 0.2 mg/dL (ref 0.0–1.0)
pH: 5.5 (ref 5.0–8.0)

## 2014-10-03 LAB — URINE MICROSCOPIC-ADD ON

## 2014-10-03 MED ORDER — PROMETHAZINE HCL 25 MG PO TABS: 25.0000 mg | ORAL_TABLET | Freq: Four times a day (QID) | ORAL | Status: DC | PRN

## 2014-10-03 NOTE — Discharge Instructions (Signed)
Take Ibuprofen as needed for fever, aching. Drink plenty of fluids.  Promethazine tablets What is this medicine? PROMETHAZINE (proe METH a zeen) is an antihistamine. It is used to treat allergic reactions and to treat or prevent nausea and vomiting from illness or motion sickness. It is also used to make you sleep before surgery, and to help treat pain or nausea after surgery. This medicine may be used for other purposes; ask your health care provider or pharmacist if you have questions. COMMON BRAND NAME(S): Phenergan What should I tell my health care provider before I take this medicine? They need to know if you have any of these conditions: -glaucoma -high blood pressure or heart disease -kidney disease -liver disease -lung or breathing disease, like asthma -prostate trouble -pain or difficulty passing urine -seizures -an unusual or allergic reaction to promethazine or phenothiazines, other medicines, foods, dyes, or preservatives -pregnant or trying to get pregnant -breast-feeding How should I use this medicine? Take this medicine by mouth with a glass of water. Follow the directions on the prescription label. Take your doses at regular intervals. Do not take your medicine more often than directed. Talk to your pediatrician regarding the use of this medicine in children. Special care may be needed. This medicine should not be given to infants and children younger than 30 years old. Overdosage: If you think you have taken too much of this medicine contact a poison control center or emergency room at once. NOTE: This medicine is only for you. Do not share this medicine with others. What if I miss a dose? If you miss a dose, take it as soon as you can. If it is almost time for your next dose, take only that dose. Do not take double or extra doses. What may interact with this medicine? Do not take this medicine with any of the following  medications: -cisapride -dofetilide -dronedarone -MAOIs like Carbex, Eldepryl, Marplan, Nardil, Parnate -pimozide -quinidine, including dextromethorphan; quinidine -thioridazine -ziprasidone This medicine may also interact with the following medications: -certain medicines for depression, anxiety, or psychotic disturbances -certain medicines for anxiety or sleep -certain medicines for seizures like carbamazepine, phenobarbital, phenytoin -certain medicines for movement abnormalities as in Parkinson's disease, or for gastrointestinal problems -epinephrine -medicines for allergies or colds -muscle relaxants -narcotic medicines for pain -other medicines that prolong the QT interval (cause an abnormal heart rhythm) -tramadol -trimethobenzamide This list may not describe all possible interactions. Give your health care provider a list of all the medicines, herbs, non-prescription drugs, or dietary supplements you use. Also tell them if you smoke, drink alcohol, or use illegal drugs. Some items may interact with your medicine. What should I watch for while using this medicine? Tell your doctor or health care professional if your symptoms do not start to get better in 1 to 2 days. You may get drowsy or dizzy. Do not drive, use machinery, or do anything that needs mental alertness until you know how this medicine affects you. To reduce the risk of dizzy or fainting spells, do not stand or sit up quickly, especially if you are an older patient. Alcohol may increase dizziness and drowsiness. Avoid alcoholic drinks. Your mouth may get dry. Chewing sugarless gum or sucking hard candy, and drinking plenty of water may help. Contact your doctor if the problem does not go away or is severe. This medicine may cause dry eyes and blurred vision. If you wear contact lenses you may feel some discomfort. Lubricating drops may help. See your eye  doctor if the problem does not go away or is severe. This medicine  can make you more sensitive to the sun. Keep out of the sun. If you cannot avoid being in the sun, wear protective clothing and use sunscreen. Do not use sun lamps or tanning beds/booths. If you are diabetic, check your blood-sugar levels regularly. What side effects may I notice from receiving this medicine? Side effects that you should report to your doctor or health care professional as soon as possible: -blurred vision -irregular heartbeat, palpitations or chest pain -muscle or facial twitches -pain or difficulty passing urine -seizures -skin rash -slowed or shallow breathing -unusual bleeding or bruising -yellowing of the eyes or skin Side effects that usually do not require medical attention (report to your doctor or health care professional if they continue or are bothersome): -headache -nightmares, agitation, nervousness, excitability, not able to sleep (these are more likely in children) -stuffy nose This list may not describe all possible side effects. Call your doctor for medical advice about side effects. You may report side effects to FDA at 1-800-FDA-1088. Where should I keep my medicine? Keep out of the reach of children. Store at room temperature, between 20 and 25 degrees C (68 and 77 degrees F). Protect from light. Throw away any unused medicine after the expiration date. NOTE: This sheet is a summary. It may not cover all possible information. If you have questions about this medicine, talk to your doctor, pharmacist, or health care provider.  2015, [employer]. (2013-01-25 15:04:46)

## 2014-10-04 ENCOUNTER — Encounter: Payer: Medicaid Other | Admitting: Obstetrics & Gynecology

## 2014-10-09 ENCOUNTER — Ambulatory Visit (INDEPENDENT_AMBULATORY_CARE_PROVIDER_SITE_OTHER): Payer: Self-pay | Admitting: Obstetrics & Gynecology

## 2014-10-09 ENCOUNTER — Encounter: Payer: Self-pay | Admitting: Obstetrics & Gynecology

## 2014-10-09 VITALS — BP 120/80 | HR 60 | Wt 180.0 lb

## 2014-10-09 DIAGNOSIS — Z9889 Other specified postprocedural states: Secondary | ICD-10-CM

## 2014-10-09 NOTE — Progress Notes (Signed)
Patient ID: Rita Oconnor, female   DOB: 10/13/1985, 29 y.o.   MRN: [id number]  HPI: Patient returns for routine postoperative follow-up having undergone laparoscopic salpingectomy and failed ablation on 09/27/2014.  The patient's immediate postoperative recovery has been unremarkable. Since hospital discharge the patient reports no problems.   Current Outpatient Prescriptions: HYDROcodone-acetaminophen (NORCO/VICODIN) 5-325 MG per tablet, Take 1 tablet by mouth every 6 (six) hours as needed. (Patient not taking: Reported on 10/09/2014), Disp: 30 tablet, Rfl: 0 ibuprofen (ADVIL,MOTRIN) 600 MG tablet, Take 1 tablet (600 mg total) by mouth every 6 (six) hours. (Patient not taking: Reported on 08/24/2014), Disp: 30 tablet, Rfl: 0 ketorolac (TORADOL) 10 MG tablet, Take 1 tablet (10 mg total) by mouth every 8 (eight) hours as needed. (Patient not taking: Reported on 10/09/2014), Disp: 15 tablet, Rfl: 0 ondansetron (ZOFRAN) 8 MG tablet, Take 1 tablet (8 mg total) by mouth every 8 (eight) hours as needed for nausea. (Patient not taking: Reported on 10/09/2014), Disp: 12 tablet, Rfl: 0 promethazine (PHENERGAN) 25 MG tablet, Take 1 tablet (25 mg total) by mouth every 6 (six) hours as needed for nausea or vomiting. (Patient not taking: Reported on 10/09/2014), Disp: 30 tablet, Rfl: 0  No current facility-administered medications for this visit.    Blood pressure 120/80, pulse 60, weight 180 lb (81.647 kg), currently breastfeeding.  Physical Exam: Incisions clean dry intact Abdomen soft normal post op  Diagnostic Tests: none  Pathology: benign  Impression: S/P  Laparoscopic salpingectomy, failed ablation  Plan: Normal post op course  Follow up: prn  Letta Median/yearly  Rita Azure H, MD

## 2014-11-04 NOTE — Progress Notes (Signed)
Patient ID: Rita Oconnor, female   DOB: 08/22/1985, 29 y.o.   MRN: [id number] Preoperative History and Physical  Rita Oconnor is a 29 y.o. [id number] with No LMP recorded. admitted for a laparoscopic bilateral salpingectomy for sterilization and hysteroscopy uterine curettage and endometrial ablation for menometrorrhagia and dysmenorrhea.  Patient has a lifelong history of really heavy periods difficult to control and severe pain it significantly disrupt her life on a monthly basis.  She does want to proceed with a and endometrial ablation  PMH:    Past Medical History  Diagnosis Date  . Headache(784.0)     PSH:     Past Surgical History  Procedure Laterality Date  . Dilitation & currettage/hystroscopy with novasure ablation N/A 09/27/2014    Procedure: HYSTEROSCOPY, UTERINE CURETTAGE,  FAILED ENDOMETRIAL ABLATION (Procedure #2) ;  Surgeon: Lazaro ArmsLuther H Jaiyanna Safran, MD;  Location: [facility];  Service: Gynecology;  Laterality: N/A;  . Laparoscopic bilateral salpingectomy Bilateral 09/27/2014    Procedure: LAPAROSCOPIC BILATERAL SALPINGECTOMY (Procedure #1);  Surgeon: Lazaro ArmsLuther H Yudith Norlander, MD;  Location: [facility];  Service: Gynecology;  Laterality: Bilateral;    POb/GynH:      OB History    Gravida Para Term Preterm AB TAB SAB Ectopic Multiple Living   4 4 4       0 4      SH:   History  Substance Use Topics  . Smoking status: Never Smoker   . Smokeless tobacco: Never Used  . Alcohol Use: No    FH:    Family History  Problem Relation Age of Onset  . Cancer Paternal Aunt     breast  . Coronary artery disease Maternal Grandfather      Allergies: No Known Allergies  Medications:       Current outpatient prescriptions:  .  HYDROcodone-acetaminophen (NORCO/VICODIN) 5-325 MG per tablet, Take 1 tablet by mouth every 6 (six) hours as needed. (Patient not taking: Reported on 10/09/2014), Disp: 30 tablet, Rfl: 0 .  ibuprofen (ADVIL,MOTRIN) 600 MG tablet, Take 1 tablet (600 mg total) by mouth every 6 (six)  hours. (Patient not taking: Reported on 08/24/2014), Disp: 30 tablet, Rfl: 0 .  ketorolac (TORADOL) 10 MG tablet, Take 1 tablet (10 mg total) by mouth every 8 (eight) hours as needed. (Patient not taking: Reported on 10/09/2014), Disp: 15 tablet, Rfl: 0 .  ondansetron (ZOFRAN) 8 MG tablet, Take 1 tablet (8 mg total) by mouth every 8 (eight) hours as needed for nausea. (Patient not taking: Reported on 10/09/2014), Disp: 12 tablet, Rfl: 0 .  promethazine (PHENERGAN) 25 MG tablet, Take 1 tablet (25 mg total) by mouth every 6 (six) hours as needed for nausea or vomiting. (Patient not taking: Reported on 10/09/2014), Disp: 30 tablet, Rfl: 0  Review of Systems:   Review of Systems  Constitutional: Negative for fever, chills, weight loss, malaise/fatigue and diaphoresis.  HENT: Negative for hearing loss, ear pain, nosebleeds, congestion, sore throat, neck pain, tinnitus and ear discharge.   Eyes: Negative for blurred vision, double vision, photophobia, pain, discharge and redness.  Respiratory: Negative for cough, hemoptysis, sputum production, shortness of breath, wheezing and stridor.   Cardiovascular: Negative for chest pain, palpitations, orthopnea, claudication, leg swelling and PND.  Gastrointestinal: Positive for abdominal pain. Negative for heartburn, nausea, vomiting, diarrhea, constipation, blood in stool and melena.  Genitourinary: Negative for dysuria, urgency, frequency, hematuria and flank pain.  Musculoskeletal: Negative for myalgias, back pain, joint pain and falls.  Skin: Negative for itching and rash.  Neurological: Negative for dizziness, tingling, tremors, sensory change, speech change, focal weakness, seizures, loss of consciousness, weakness and headaches.  Endo/Heme/Allergies: Negative for environmental allergies and polydipsia. Does not bruise/bleed easily.  Psychiatric/Behavioral: Negative for depression, suicidal ideas, hallucinations, memory loss and substance abuse. The patient is  not nervous/anxious and does not have insomnia.      PHYSICAL EXAM:  Blood pressure 120/80, pulse 68, weight 180 lb (81.647 kg), currently breastfeeding.    Vitals reviewed. Constitutional: She is oriented to person, place, and time. She appears well-developed and well-nourished.  HENT:  Head: Normocephalic and atraumatic.  Right Ear: External ear normal.  Left Ear: External ear normal.  Nose: Nose normal.  Mouth/Throat: Oropharynx is clear and moist.  Eyes: Conjunctivae and EOM are normal. Pupils are equal, round, and reactive to light. Right eye exhibits no discharge. Left eye exhibits no discharge. No scleral icterus.  Neck: Normal range of motion. Neck supple. No tracheal deviation present. No thyromegaly present.  Cardiovascular: Normal rate, regular rhythm, normal heart sounds and intact distal pulses.  Exam reveals no gallop and no friction rub.   No murmur heard. Respiratory: Effort normal and breath sounds normal. No respiratory distress. She has no wheezes. She has no rales. She exhibits no tenderness.  GI: Soft. Bowel sounds are normal. She exhibits no distension and no mass. There is tenderness. There is no rebound and no guarding.  Genitourinary:       Vulva is normal without lesions Vagina is pink moist without discharge Cervix normal in appearance and pap is normal Uterus is normal size, contour, position, consistency, mobility, non-tender Adnexa is negative with normal sized ovaries by sonogram  Musculoskeletal: Normal range of motion. She exhibits no edema and no tenderness.  Neurological: She is alert and oriented to person, place, and time. She has normal reflexes. She displays normal reflexes. No cranial nerve deficit. She exhibits normal muscle tone. Coordination normal.  Skin: Skin is warm and dry. No rash noted. No erythema. No pallor.  Psychiatric: She has a normal mood and affect. Her behavior is normal. Judgment and thought content normal.    Labs: No  results found for this or any previous visit (from the past 336 hour(s)).  EKG: Orders placed or performed during the hospital encounter of 12/03/11  . ED EKG  . ED EKG  . EKG 12-Lead  . EKG 12-Lead  . EKG    Imaging Studies: No results found.    Assessment:  Consultation for sterilization  Menorrhagia with regular cycle  Dysmenorrhea   Patient Active Problem List   Diagnosis Date Noted  . Anemia during pregnancy in third trimester 05/08/2014    Plan: Laparoscopic salpingectomy plus hysteroscopy D&C endometrial ablation  Caulin Begley H

## 2024-04-18 ENCOUNTER — Emergency Department (HOSPITAL_COMMUNITY)
Admission: EM | Admit: 2024-04-18 | Discharge: 2024-04-18 | Disposition: A | Discharge status: Home / Self Care | Payer: Self-pay | Attending: Emergency Medicine | Admitting: Emergency Medicine

## 2024-04-18 ENCOUNTER — Encounter (HOSPITAL_COMMUNITY): Payer: Self-pay | Admitting: Emergency Medicine

## 2024-04-18 ENCOUNTER — Emergency Department (HOSPITAL_COMMUNITY): Payer: Self-pay

## 2024-04-18 ENCOUNTER — Other Ambulatory Visit: Payer: Self-pay

## 2024-04-18 DIAGNOSIS — R0789 Other chest pain: Secondary | ICD-10-CM | POA: Insufficient documentation

## 2024-04-18 DIAGNOSIS — R079 Chest pain, unspecified: Secondary | ICD-10-CM

## 2024-04-18 LAB — BASIC METABOLIC PANEL WITH GFR
Anion gap: 14 (ref 5–15)
BUN: 13 mg/dL (ref 6–20)
CO2: 25 mmol/L (ref 22–32)
Calcium: 9.6 mg/dL (ref 8.9–10.3)
Chloride: 104 mmol/L (ref 98–111)
Creatinine, Ser: 0.6 mg/dL (ref 0.44–1.00)
GFR, Estimated: >60 mL/min (ref >60)
Glucose, Bld: 90 mg/dL (ref 70–99)
Potassium: 3.8 mmol/L (ref 3.5–5.1)
Sodium: 142 mmol/L (ref 135–145)

## 2024-04-18 LAB — TROPONIN T, HIGH SENSITIVITY: Troponin T High Sensitivity: <15 ng/L (ref 0–19)

## 2024-04-18 LAB — CBC
HCT: 39.6 % (ref 36.0–46.0)
Hemoglobin: 13.1 g/dL (ref 12.0–15.0)
MCH: 31.3 pg (ref 26.0–34.0)
MCHC: 33.1 g/dL (ref 30.0–36.0)
MCV: 94.5 fL (ref 80.0–100.0)
Platelets: 310 K/uL (ref 150–400)
RBC: 4.19 MIL/uL (ref 3.87–5.11)
RDW: 13.2 % (ref 11.5–15.5)
WBC: 14 K/uL — ABNORMAL HIGH (ref 4.0–10.5)
nRBC: 0 % (ref 0.0–0.2)

## 2024-04-18 LAB — HEPATIC FUNCTION PANEL
ALT: 23 U/L (ref 0–44)
AST: 20 U/L (ref 15–41)
Albumin: 5.1 g/dL — ABNORMAL HIGH (ref 3.5–5.0)
Alkaline Phosphatase: 71 U/L (ref 38–126)
Bilirubin, Direct: 0.1 mg/dL (ref 0.0–0.2)
Indirect Bilirubin: 0.2 mg/dL — ABNORMAL LOW (ref 0.3–0.9)
Total Bilirubin: 0.3 mg/dL (ref 0.0–1.2)
Total Protein: 8.4 g/dL — ABNORMAL HIGH (ref 6.5–8.1)

## 2024-04-18 LAB — POC URINE PREG, ED: Preg Test, Ur: NEGATIVE

## 2024-04-18 LAB — LIPASE, BLOOD: Lipase: 35 U/L (ref 11–51)

## 2024-04-18 MED ORDER — METHOCARBAMOL 500 MG PO TABS: 500.0000 mg | ORAL_TABLET | Freq: Three times a day (TID) | ORAL | 0 refills | Status: AC | PRN

## 2024-04-18 MED ORDER — KETOROLAC TROMETHAMINE 15 MG/ML IJ SOLN
15.0000 mg | Freq: Once | INTRAMUSCULAR | Status: AC
  Administered 2024-04-18: 15 mg via INTRAVENOUS
  Filled 2024-04-18: qty 1

## 2024-04-18 NOTE — ED Triage Notes (Signed)
 Pt c/o right sided chest pain x 7-10 days that is not on the left side today with radiation into her back, denies n/v/cardiac hx

## 2024-04-18 NOTE — ED Provider Notes (Signed)
 [place] EMERGENCY DEPARTMENT AT [facility] Provider Note   CSN: [id number] Arrival date & time: 04/18/24  1652     Patient presents with: Chest Pain   Rita Oconnor is a 38 y.o. female.    Chest Pain Patient presents for right-sided chest pain.  Has had for about a week and a half.  Comes and goes.  More feels that it is coming from the back and her chest area.  No radiation down the arm.  No numbness weakness.  Is worse with certain movements.  Worse with certain rest.  Not associate with food.  No swelling in her legs.  No cough.  No recent travel.  Not exertional.  Patient does not smoke.     Prior to Admission medications   Medication Sig Start Date End Date Taking? Authorizing Provider  ibuprofen  (ADVIL ) 800 MG tablet Take 800 mg by mouth every 6 (six) hours as needed for mild pain (pain score 1-3).   Yes [provider]  methocarbamol (ROBAXIN) 500 MG tablet Take 1 tablet (500 mg total) by mouth every 8 (eight) hours as needed. 04/18/24  Yes Patsey Lot, MD    Allergies: Patient has no known allergies.    Review of Systems  Cardiovascular:  Positive for chest pain.    Updated Vital Signs BP 139/84 (BP Location: Right Arm)   Pulse (!) 59   Temp 98.4 F (36.9 C) (Oral)   Resp 19   Ht 5' 11 (1.803 m)   Wt 88.9 kg   LMP 04/04/2024 (Exact Date)   SpO2 100%   Breastfeeding No   BMI 27.34 kg/m   Physical Exam Vitals and nursing note reviewed.  Cardiovascular:     Rate and Rhythm: Normal rate.  Chest:     Chest wall: Tenderness present.     Comments: Tenderness with palpation on anterior chest wall. Abdominal:     Tenderness: There is no abdominal tenderness.  Musculoskeletal:     Right lower leg: No tenderness. No edema.     Left lower leg: No tenderness. No edema.  Neurological:     Mental Status: She is alert.     (all labs ordered are listed, but only abnormal results are displayed) Labs Reviewed  CBC - Abnormal;  Notable for the following components:      Result Value   WBC 14.0 (*)    All other components within normal limits  HEPATIC FUNCTION PANEL - Abnormal; Notable for the following components:   Total Protein 8.4 (*)    Albumin 5.1 (*)    Indirect Bilirubin 0.2 (*)    All other components within normal limits  BASIC METABOLIC PANEL WITH GFR  LIPASE, BLOOD  POC URINE PREG, ED  TROPONIN T, HIGH SENSITIVITY    EKG: EKG Interpretation Date/Time:  Monday April 18 2024 16:59:09 EST Ventricular Rate:  86 PR Interval:  128 QRS Duration:  76 QT Interval:  384 QTC Calculation: 459 R Axis:   88  Text Interpretation: Normal sinus rhythm Normal ECG When compared with ECG of 27-Sep-2014 12:09, No significant change was found Confirmed by Patsey Lot [id number]) on 04/18/2024 5:00:38 PM  Radiology: [facility] Chest 2 View Result Date: 04/18/2024 EXAM: 2 VIEW(S) XRAY OF THE CHEST 04/18/2024 05:22:00 PM COMPARISON: None available. CLINICAL HISTORY: chest pain FINDINGS: LUNGS AND PLEURA: No focal pulmonary opacity. No pulmonary edema. No pleural effusion. No pneumothorax. HEART AND MEDIASTINUM: No acute abnormality of the cardiac and mediastinal silhouettes. BONES AND SOFT  TISSUES: No acute osseous abnormality. IMPRESSION: 1. No acute cardiopulmonary process detected Electronically signed by: Norman Gatlin MD 04/18/2024 05:51 PM EST RP Workstation: [id number]     Procedures   Medications Ordered in the ED  ketorolac  (TORADOL ) 15 MG/ML injection 15 mg (15 mg Intravenous Given 04/18/24 1904)                                    Medical Decision Making Amount and/or Complexity of Data Reviewed Labs: ordered. Radiology: ordered.  Risk Prescription drug management.   Patient with right sided chest/back pain.  At times will radiate towards abdomen.  Not associate eating.  Differential diagnose includes musculoskeletal pain but also causes such as coronary artery disease, aortic dissection, pulm  embolism.  EKG reassuring. Will get x-ray and blood work.  Workup reassuring.  Negative troponin.  Negative x-ray.  Negative LFTs.  Doubt cardiac ischemia.  Doubt PE.  Potentially musculoskeletal since it is reproducible with palpation.  Symptomatic treatment and discharge home.     Final diagnoses:  Nonspecific chest pain    ED Discharge Orders          Ordered    methocarbamol (ROBAXIN) 500 MG tablet  Every 8 hours PRN        04/18/24 1839               Patsey Lot, MD 04/18/24 2243
